# Patient Record
Sex: Female | Born: 1935 | Race: White | Hispanic: No | Marital: Married | Smoking: Never smoker
Health system: Southern US, Community
[De-identification: ages and names within clinical notes are randomized; demographics above are authoritative.]

## PROBLEM LIST (undated history)

## (undated) DIAGNOSIS — Z8489 Family history of other specified conditions: Secondary | ICD-10-CM

## (undated) DIAGNOSIS — M51369 Other intervertebral disc degeneration, lumbar region without mention of lumbar back pain or lower extremity pain: Secondary | ICD-10-CM

## (undated) DIAGNOSIS — I10 Essential (primary) hypertension: Secondary | ICD-10-CM

## (undated) DIAGNOSIS — R7303 Prediabetes: Secondary | ICD-10-CM

## (undated) DIAGNOSIS — M1990 Unspecified osteoarthritis, unspecified site: Secondary | ICD-10-CM

## (undated) HISTORY — PX: RECTOCELE REPAIR: SHX761

## (undated) HISTORY — PX: COLONOSCOPY: SHX174

## (undated) HISTORY — PX: CATARACT EXTRACTION: SUR2

## (undated) HISTORY — PX: ABDOMINAL HYSTERECTOMY: SHX81

---

## 1999-05-18 ENCOUNTER — Encounter: Payer: Self-pay | Admitting: Obstetrics and Gynecology

## 1999-05-18 ENCOUNTER — Encounter: Admission: RE | Admit: 1999-05-18 | Discharge: 1999-05-18 | Payer: Self-pay | Admitting: Obstetrics and Gynecology

## 1999-06-10 ENCOUNTER — Encounter: Admission: RE | Admit: 1999-06-10 | Discharge: 1999-06-10 | Payer: Self-pay | Admitting: Obstetrics and Gynecology

## 1999-06-10 ENCOUNTER — Encounter: Payer: Self-pay | Admitting: Obstetrics and Gynecology

## 1999-06-24 ENCOUNTER — Ambulatory Visit (HOSPITAL_COMMUNITY): Admission: RE | Admit: 1999-06-24 | Discharge: 1999-06-24 | Payer: Self-pay | Admitting: *Deleted

## 1999-06-24 ENCOUNTER — Encounter: Payer: Self-pay | Admitting: *Deleted

## 1999-12-23 ENCOUNTER — Encounter: Admission: RE | Admit: 1999-12-23 | Discharge: 1999-12-23 | Payer: Self-pay | Admitting: *Deleted

## 1999-12-23 ENCOUNTER — Encounter: Payer: Self-pay | Admitting: *Deleted

## 2000-07-05 ENCOUNTER — Encounter: Admission: RE | Admit: 2000-07-05 | Discharge: 2000-07-05 | Payer: Self-pay | Admitting: Obstetrics and Gynecology

## 2000-07-05 ENCOUNTER — Encounter: Payer: Self-pay | Admitting: Obstetrics and Gynecology

## 2001-07-10 ENCOUNTER — Encounter: Payer: Self-pay | Admitting: Family Medicine

## 2001-07-10 ENCOUNTER — Encounter: Admission: RE | Admit: 2001-07-10 | Discharge: 2001-07-10 | Payer: Self-pay | Admitting: Family Medicine

## 2002-08-29 ENCOUNTER — Encounter: Payer: Self-pay | Admitting: Obstetrics and Gynecology

## 2002-08-29 ENCOUNTER — Encounter: Admission: RE | Admit: 2002-08-29 | Discharge: 2002-08-29 | Payer: Self-pay | Admitting: Obstetrics and Gynecology

## 2002-09-19 ENCOUNTER — Encounter: Admission: RE | Admit: 2002-09-19 | Discharge: 2002-12-18 | Payer: Self-pay | Admitting: Family Medicine

## 2003-08-27 ENCOUNTER — Encounter: Admission: RE | Admit: 2003-08-27 | Discharge: 2003-08-27 | Payer: Self-pay | Admitting: Obstetrics and Gynecology

## 2003-09-02 ENCOUNTER — Encounter: Admission: RE | Admit: 2003-09-02 | Discharge: 2003-09-02 | Payer: Self-pay | Admitting: Obstetrics and Gynecology

## 2003-10-17 ENCOUNTER — Ambulatory Visit (HOSPITAL_COMMUNITY): Admission: RE | Admit: 2003-10-17 | Discharge: 2003-10-17 | Payer: Self-pay | Admitting: Gastroenterology

## 2003-12-05 ENCOUNTER — Observation Stay (HOSPITAL_COMMUNITY): Admission: RE | Admit: 2003-12-05 | Discharge: 2003-12-06 | Payer: Self-pay | Admitting: Obstetrics and Gynecology

## 2003-12-05 ENCOUNTER — Encounter (INDEPENDENT_AMBULATORY_CARE_PROVIDER_SITE_OTHER): Payer: Self-pay | Admitting: Specialist

## 2004-11-11 ENCOUNTER — Encounter: Admission: RE | Admit: 2004-11-11 | Discharge: 2004-11-11 | Payer: Self-pay | Admitting: Family Medicine

## 2005-03-15 IMAGING — CR DG CHEST 2V
2 series · 2 of 2 positions shown · non-contrast
Comparison: none

CLINICAL DATA: Rectocele.  Preop respiratory exam. 
 CHEST (TWO VIEWS) 11/28/03

[view not recorded (1 of 2)]
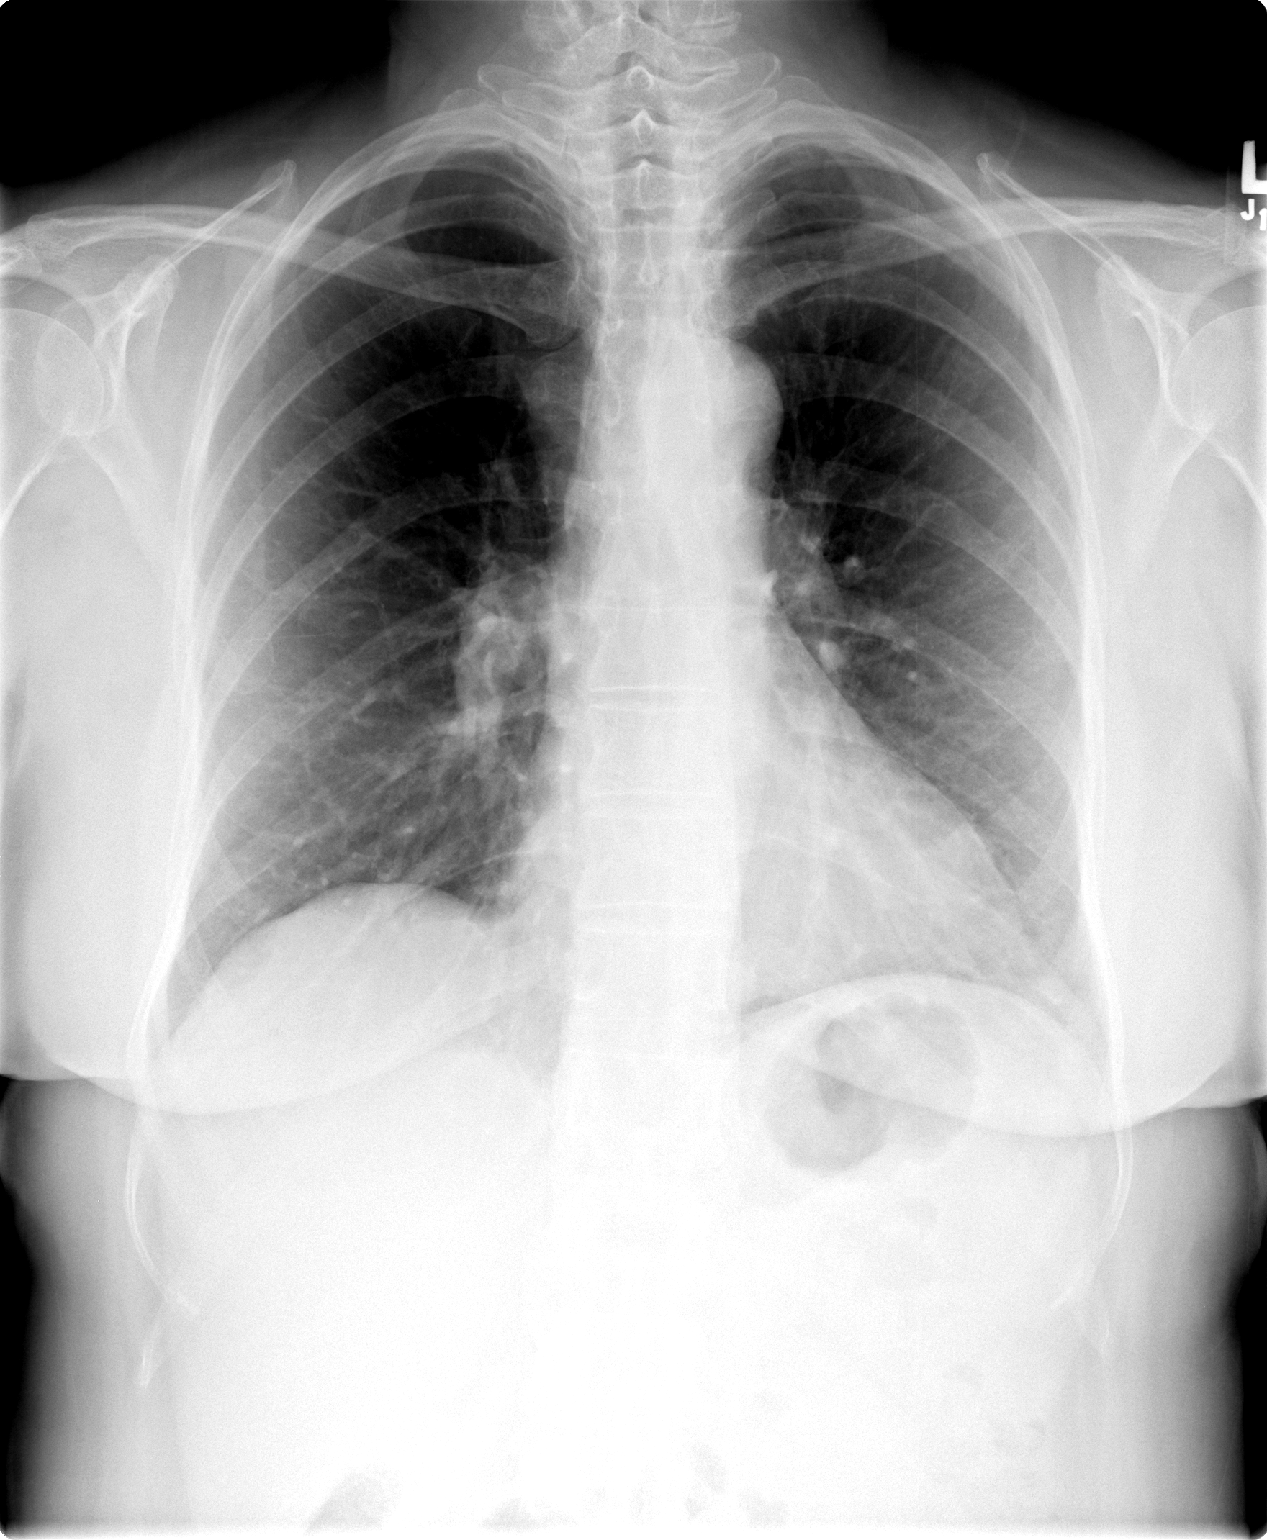

[view not recorded (2 of 2)]
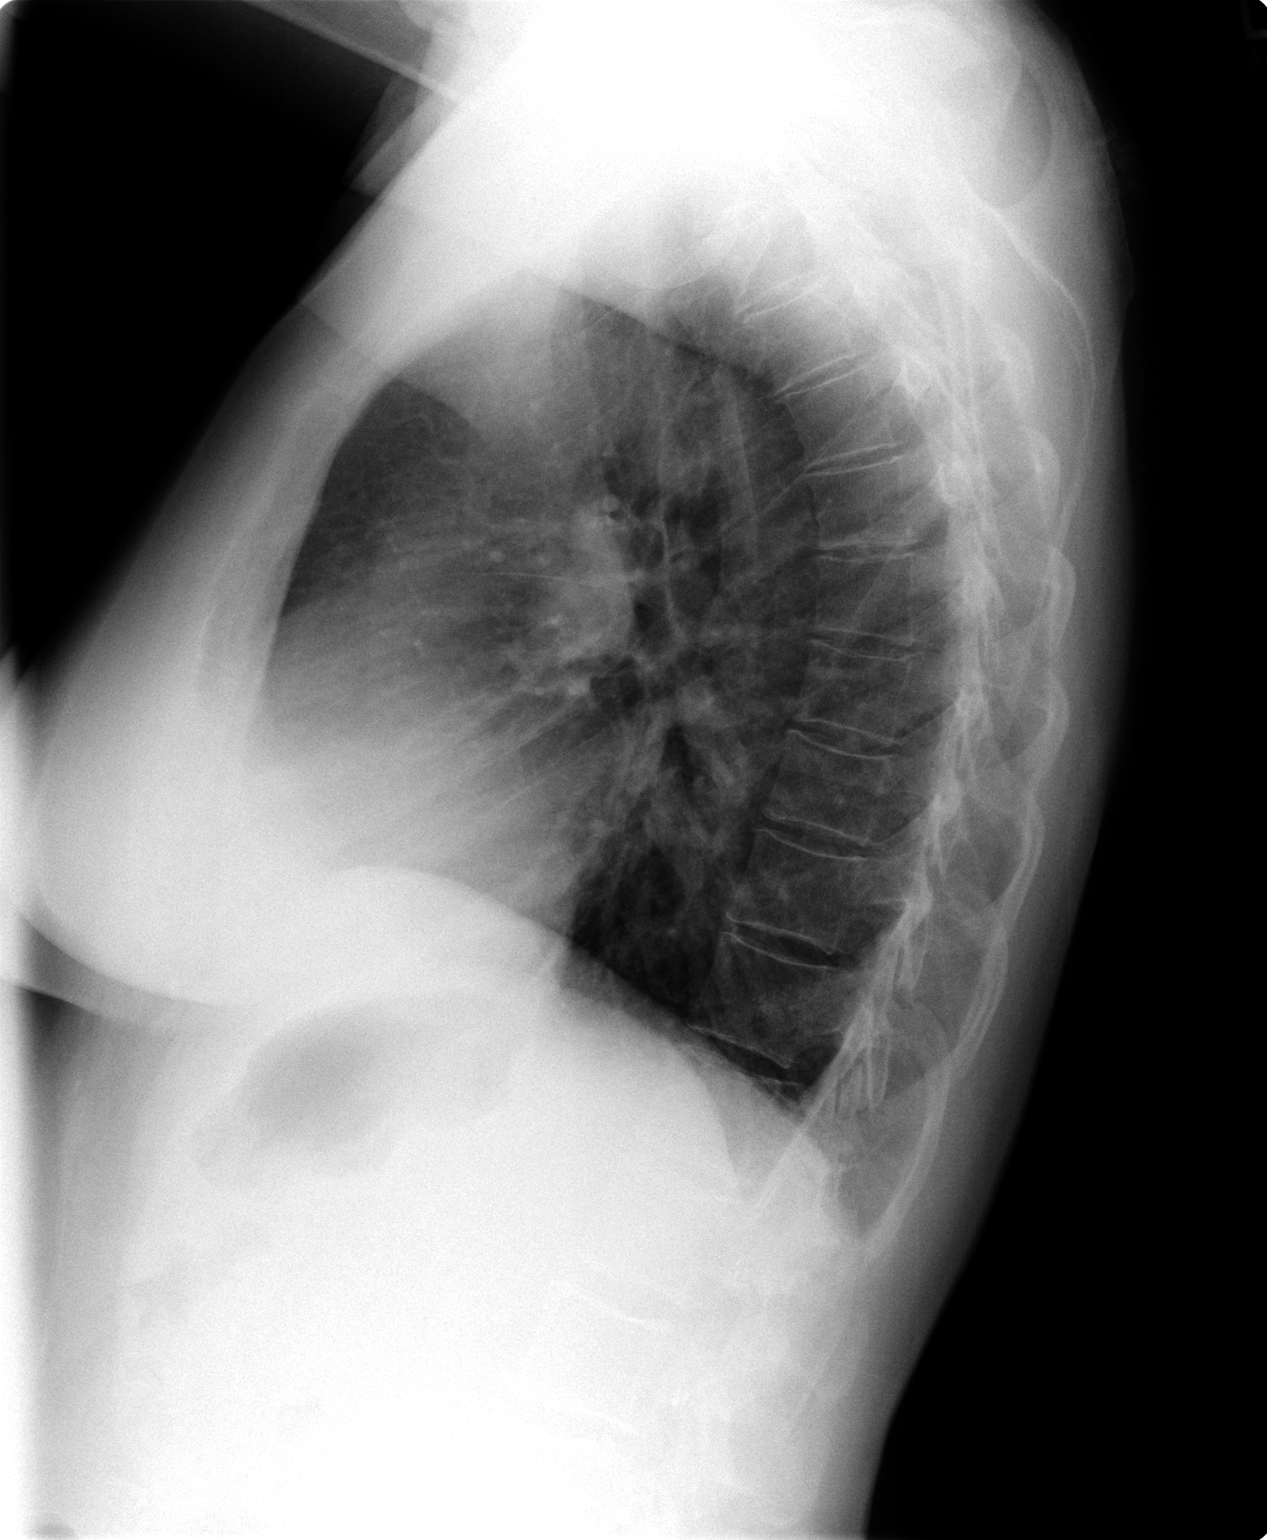

[2 of 2 positions shown; findings below may reference images not displayed]

FINDINGS: Minimal peribronchial thickening is noted and may be chronic.  A few small scattered calcified granulomas in the lungs are noted.  Reminder of lungs are clear.  The bony thorax and upper abdomen are unremarkable. 
 IMPRESSION
 No evidence of acute cardiopulmonary disease. 
 Evidence of previous granulomatous disease and mild bronchitic changes.

## 2005-09-13 ENCOUNTER — Encounter: Admission: RE | Admit: 2005-09-13 | Discharge: 2005-10-14 | Payer: Self-pay | Admitting: Family Medicine

## 2005-11-15 ENCOUNTER — Encounter: Admission: RE | Admit: 2005-11-15 | Discharge: 2005-12-09 | Payer: Self-pay | Admitting: Orthopedic Surgery

## 2006-01-05 ENCOUNTER — Encounter: Admission: RE | Admit: 2006-01-05 | Discharge: 2006-01-05 | Payer: Self-pay | Admitting: Obstetrics and Gynecology

## 2006-05-20 ENCOUNTER — Ambulatory Visit (HOSPITAL_BASED_OUTPATIENT_CLINIC_OR_DEPARTMENT_OTHER): Admission: RE | Admit: 2006-05-20 | Discharge: 2006-05-20 | Payer: Self-pay | Admitting: Orthopedic Surgery

## 2007-01-31 ENCOUNTER — Encounter: Admission: RE | Admit: 2007-01-31 | Discharge: 2007-01-31 | Payer: Self-pay | Admitting: Obstetrics and Gynecology

## 2008-03-12 ENCOUNTER — Encounter: Admission: RE | Admit: 2008-03-12 | Discharge: 2008-03-12 | Payer: Self-pay | Admitting: Obstetrics and Gynecology

## 2008-04-03 ENCOUNTER — Encounter: Admission: RE | Admit: 2008-04-03 | Discharge: 2008-04-03 | Payer: Self-pay | Admitting: Family Medicine

## 2009-04-02 ENCOUNTER — Encounter: Admission: RE | Admit: 2009-04-02 | Discharge: 2009-04-02 | Payer: Self-pay | Admitting: Obstetrics and Gynecology

## 2009-05-27 ENCOUNTER — Ambulatory Visit: Payer: Self-pay | Admitting: Internal Medicine

## 2009-06-11 LAB — CBC WITH DIFFERENTIAL/PLATELET
Eosinophils Absolute: 0.3 10*3/uL (ref 0.0–0.5)
HCT: 41.6 % (ref 34.8–46.6)
LYMPH%: 29.5 % (ref 14.0–49.7)
MONO#: 0.7 10*3/uL (ref 0.1–0.9)
NEUT#: 4.3 10*3/uL (ref 1.5–6.5)
NEUT%: 56.8 % (ref 38.4–76.8)
Platelets: 238 10*3/uL (ref 145–400)
WBC: 7.5 10*3/uL (ref 3.9–10.3)

## 2009-06-17 LAB — COMPREHENSIVE METABOLIC PANEL
CO2: 25 mEq/L (ref 19–32)
Calcium: 9.6 mg/dL (ref 8.4–10.5)
Glucose, Bld: 98 mg/dL (ref 70–99)
Sodium: 141 mEq/L (ref 135–145)
Total Bilirubin: 0.5 mg/dL (ref 0.3–1.2)
Total Protein: 6.9 g/dL (ref 6.0–8.3)

## 2009-06-17 LAB — OTHER SOLSTAS TEST

## 2009-06-17 LAB — LACTATE DEHYDROGENASE: LDH: 142 U/L (ref 94–250)

## 2010-05-22 ENCOUNTER — Encounter
Admission: RE | Admit: 2010-05-22 | Discharge: 2010-05-22 | Payer: Self-pay | Source: Home / Self Care | Attending: Obstetrics and Gynecology | Admitting: Obstetrics and Gynecology

## 2010-07-05 ENCOUNTER — Encounter: Payer: Self-pay | Admitting: Family Medicine

## 2010-07-05 ENCOUNTER — Encounter: Payer: Self-pay | Admitting: Obstetrics and Gynecology

## 2010-10-30 NOTE — H&P (Signed)
NAME:  Monica Henderson, Monica Henderson                    ACCOUNT NO.:  [id number]   MEDICAL RECORD NO.:  [id number]                   PATIENT TYPE:  AMB   LOCATION:  DFTL                                 FACILITY:  [facility]   PHYSICIAN:  Katherine Roan, M.D.               DATE OF BIRTH:  [birth year]   DATE OF ADMISSION:  DATE OF DISCHARGE:                                HISTORY & PHYSICAL   CHIEF COMPLAINT:  Pelvic bulging and perineal bulging.   HISTORY OF PRESENT ILLNESS:  Monica Henderson is a 76 year old gravida 3, para  2 female, status post abdominal hysterectomy done in 1985 by Dr. Homero Fellers  __________ for abnormal bleeding who presents for repair of a complex  posterior segment relaxation consisting of enterocele and rectocele and  probable perineoplasty. Her comorbidities include hypertension for which she  takes Norvasc, Dyazide, and potassium. She has no known drug allergies. She  has never received a blood transfusion. She had a tubal ligation in 1971.   REVIEW OF SYSTEMS:  HEENT: She wears glasses, but has noted no decrease in  visual or auditory acuity. No headache. HEART: No history of chest pain or  shortness of breath. Her hypertension is well controlled at this point. She  denies history of heart murmur or mitral valve prolapse.  LUNGS: No chronic  cough. GU: She denies stress incontinence, but notes some times she has  difficulty emptying her bladder. She has noted perineal bulging and just  some vague lower abdominal discomfort.  She has had no bowel habit change.  No melena. Recent colonoscopy was normal.  MUSCLES, BONES, AND JOINTS: No  fractures or arthritis.   SOCIAL HISTORY:  She is retired. She drinks alcohol seldom, socially seldom.   FAMILY HISTORY:  Mother died at age 72. Father died at 3 with lung cancer.  She has two brothers. One has a stent and one sister had a cerebral  aneurysm. She has a paternal aunt with colon cancer. No diabetes.   PHYSICAL EXAMINATION:   GENERAL: A well-developed, well-nourished, and alert  female who is oriented to time, place, and recent events and appears to be  her stated age.  HEENT: Eyes, ears, nose, and throat are unremarkable. Oropharynx is not  injected.  NECK: Supple. Carotid pulses are equal without bruits. Trachea is in the  midline. No adenopathy appreciated.  BREASTS: No masses or tenderness.  LUNGS: Clear to auscultation and percussion.  HEART: Normal sinus rhythm with no murmurs. No heaves, thrills, rubs, or  gallops.  ABDOMEN: Soft. Liver, spleen, and kidneys are not palpated. Bowel sounds are  normal. No bruits are heard. No tenderness.  EXTREMITIES: Good range of motion. Equal pulses and reflexes.  PELVIC EXAM: A complex posterior relaxation with a large what I would call a  cystocele or enterocele and rectocele. No pelvic masses are noted.   IMPRESSION:  Large posterior segment relaxation with enterocele and  rectocele.   PLAN:  Complex repair. I have discussed risks and benefits with the patient  including failure rates and dyspareunia, damage to bladder and bowel. She is  prepared to proceed.                                               Katherine Roan, M.D.    SDM/MEDQ  D:  12/03/2003  T:  12/03/2003  Job:  [id number]   cc:   Vikki Ports, M.D.  [address]  [place]  [place] [address]  Fax: [phone]

## 2010-10-30 NOTE — Op Note (Signed)
NAMECLORINDA, Monica Henderson          ACCOUNT NO.:  [id number]   MEDICAL RECORD NO.:  [id number]          PATIENT TYPE:  AMB   LOCATION:  DSC                          FACILITY:  [facility]   PHYSICIAN:  Cindee Salt, M.D.       DATE OF BIRTH:  14-Feb-1935   DATE OF PROCEDURE:  05/20/2006  DATE OF DISCHARGE:                               OPERATIVE REPORT   PREOPERATIVE DIAGNOSIS:  Foreign body, nail bed, right little finger.   POSTOPERATIVE DIAGNOSIS:  Foreign body, nail bed, right little finger.   OPERATION:  Removal of nail plate, foreign body, right little finger  nail bed.   SURGEON:  Merlyn Lot, M.D.   ASSISTANTCarolyne Fiscal, R.N.   ANESTHESIA:  Metacarpal block.   HISTORY:  The patient is a 76 year old female who suffered an injury to  her right little finger, suffering a foreign body under the nail plate  in the nail bed.  She is desirous of removal.  Attempted removal was  unsuccessful.  She is brought to the minor room where a metacarpal block  was given with 1% Xylocaine without epinephrine, prepped using DuraPrep,  supine position, left arm free.  A Penrose drain was used for tourniquet  control at the base of the finger after a metacarpal block was given  with 1% Xylocaine without epinephrine; 4 mL was used.  Adequate  anesthesia was afforded.  The nail plate was removed.  The foreign body  was jammed into a portion of the nail plate, and this came off with the  nail plate.  A groove was present in the nail fold but no laceration.  The area was irrigated.  A nonadherent gauze was placed under the  proximal nail fold.  A sterile compressive dressing applied.  The  patient tolerated the procedure well.  She is discharged home to return  to the [facility] in 1 week on Darvocet-N 100.           ______________________________  Cindee Salt, M.D.     GK/MEDQ  D:  05/20/2006  T:  05/20/2006  Job:  [id number]

## 2010-10-30 NOTE — Op Note (Signed)
NAME:  Monica Henderson, Monica Henderson                    ACCOUNT NO.:  [id number]   MEDICAL RECORD NO.:  [id number]                   PATIENT TYPE:  AMB   LOCATION:  DFTL                                 FACILITY:  [facility]   PHYSICIAN:  Katherine Roan, M.D.               DATE OF BIRTH:  February 28, 1935   DATE OF PROCEDURE:  12/05/2003  DATE OF DISCHARGE:                                 OPERATIVE REPORT   PREOPERATIVE DIAGNOSES:  Pelvic relaxation.   POSTOPERATIVE DIAGNOSES:  Cystocele, rectocele, enterocele and perineal  detachment.   OPERATION PERFORMED:  Pelvic exam under anesthesia, anterior repair, repair  of enterocele, posterior repair and perineoplasty.   DESCRIPTION OF PROCEDURE:  The patient was placed in lithotomy position,  examined and found to have prolapse of the apex of the vagina as well as the  large enterocele.  The patient was then prepped for surgery, Foley catheter  was inserted, apex of the vagina was grasped and the enterocele sac was  dissected and entered, the peritoneal cavity entered. There were no  adhesions to the dome of the uterus.  I was able to feel the ureteral pop-up  anteriorly on both sides. The cystocele was then dissected carefully up to  the urethra and the pubocervical vaginal fascia and pelvic diaphragm was  dissected laterally.  The posterior segment tissue was noted just under the  enterocele sac and that was identified. The posterior peritoneum including  the very inferior portion of the uterosacral ligaments were then sutured  with 3-0 Ethibond and plicated in the midline.  The pubocervical vagina  fascia was then plicated with interrupted sutures of 3-0 Vicryl and 3-0 PDS.  I then attached the posterior segment of tissue to the anterior tissue and  then continued to plicate the fascia in the midline with interrupted sutures  of 3-0 Vicryl.  A wedge of the vagina was then removed and then the vagina  anteriorly was closed with interrupted sutures of 3-0  Vicryl.  Posteriorly  we went down below and dissected the perineal body, took a wedge of the  perineum out and dissected the vagina laterally to plicate the prerectal  fascia in the midline. I then inserted a pack in the vagina on 1/2 inch  iodoform.  The perineoplasty was accomplished by closing the bulbocavernosus  muscle and superficial transverse perinei muscle with interrupted sutures of  3-0 Vicryl. The skin was closed with subcuticular 3-0 Vicryl and the vaginal  mucosa was closed posteriorly with a locking suture of 2-0 chromic.  The  pack was inserted, Foley catheter was draining clear urine. [name]  tolerated the procedure well and was sent to the recovery room in good  condition.  Katherine Roan, M.D.    SDM/MEDQ  D:  12/05/2003  T:  12/05/2003  Job:  [id number]   cc:   Vikki Ports, M.D.  [address]  [place]  [place] [address]  Fax: [phone]

## 2010-10-30 NOTE — Discharge Summary (Signed)
NAME:  Monica Henderson, Monica Henderson                    ACCOUNT NO.:  [id number]   MEDICAL RECORD NO.:  [id number]                   PATIENT TYPE:  OBV   LOCATION:  0457                                 FACILITY:  [facility]   PHYSICIAN:  Katherine Roan, M.D.               DATE OF BIRTH:  June 15, 1934   DATE OF ADMISSION:  12/05/2003  DATE OF DISCHARGE:  12/06/2003                                 DISCHARGE SUMMARY   ADMISSION DIAGNOSIS:  Pelvic relaxation.   DISCHARGE DIAGNOSIS:  Pelvic relaxation with repair of level 1, 2, and 3  support.   HISTORY:  [name] is a 76 year old female, post abdominal  hysterectomy in 1985, who presents for repair of what initially was thought  to be a large enterocele and rectocele.  She complained of perineal bulging  and pelvic discomfort.  She was a healthy lady for her age.  On no  particular medication.   LABORATORY DATA:  Hemoglobin on admission was 13.  On December 06, 2003, it was  10.8.  On December 06, 2003, her potassium was 3.   Echocardiogram was normal.  Chest x-ray was normal.   HOSPITAL COURSE:  This patient was admitted to the hospital for the above  proposed surgical procedure.  In the operating room, complete relaxation was  noted of the anterior and posterior segments with repair of a cystocele,  rectocele, enterocele, and perineoplasty or level 1, 2, and 3 support.  Pack  was removed on December 04, 2003.  Because subcomplex repair was performed, we  elected to leave the Foley in.  She is to be covered with Cipro, and in a  couple of days we will remove the Foley to check post-void residual.  Her  postoperative course was smooth.  On discharge, she was sent home with  Phenergan, Percocet, and Cipro for pain.  She will return to the office in  approximately four days.   CONDITION ON DISCHARGE:  Improved.                                               Katherine Roan, M.D.    SDM/MEDQ  D:  12/24/2003  T:  12/24/2003  Job:  [id number]

## 2010-10-30 NOTE — Op Note (Signed)
NAME:  Monica Henderson, Monica Henderson                    ACCOUNT NO.:  [id number]   MEDICAL RECORD NO.:  [id number]                   PATIENT TYPE:  AMB   LOCATION:  ENDO                                 FACILITY:  [facility]   PHYSICIAN:  Graylin Shiver, M.D.                DATE OF BIRTH:  05-19-1935   DATE OF PROCEDURE:  10/17/2003  DATE OF DISCHARGE:                                 OPERATIVE REPORT   PROCEDURE:  Colonoscopy.   INDICATIONS FOR PROCEDURE:  Screening.   Informed consent was obtained after explanation of the risks of bleeding,  infection and perforation.   PREMEDICATION:  Fentanyl 60 mcg IV, Versed 6 mg IV.   DESCRIPTION OF PROCEDURE:  With the patient in the left lateral decubitus  position a rectal exam was performed.  No masses were felt.  The Olympus  colonoscope was inserted into the rectum and advanced around the colon to  the cecum.  The cecal landmarks were identified.  The cecum and ascending  colon were normal, the transverse colon normal.  The descending colon,  sigmoid and rectum was normal.  The scope was retroflexed in the rectum.  It  looked normal.  The scope was brought out.  She tolerated the procedure well  without complications.   IMPRESSION:  Normal colonoscopy to the cecum.   I would recommend a followup screening colonoscopy again in 10 years.                                               Graylin Shiver, M.D.    [name]  D:  10/17/2003  T:  10/17/2003  Job:  [id number]   cc:   McFell, Dr.

## 2011-05-28 ENCOUNTER — Other Ambulatory Visit: Payer: Self-pay | Admitting: Obstetrics and Gynecology

## 2011-05-28 DIAGNOSIS — Z1231 Encounter for screening mammogram for malignant neoplasm of breast: Secondary | ICD-10-CM

## 2011-06-22 ENCOUNTER — Ambulatory Visit
Admission: RE | Admit: 2011-06-22 | Discharge: 2011-06-22 | Disposition: A | Discharge status: Home / Self Care | Payer: Medicare Other | Source: Ambulatory Visit | Attending: Obstetrics and Gynecology | Admitting: Obstetrics and Gynecology

## 2011-06-22 DIAGNOSIS — Z1231 Encounter for screening mammogram for malignant neoplasm of breast: Secondary | ICD-10-CM

## 2012-09-18 ENCOUNTER — Other Ambulatory Visit: Payer: Self-pay | Admitting: Orthopedic Surgery

## 2012-09-18 DIAGNOSIS — M899 Disorder of bone, unspecified: Secondary | ICD-10-CM

## 2012-09-20 ENCOUNTER — Other Ambulatory Visit: Payer: Self-pay

## 2012-09-20 DIAGNOSIS — Z1231 Encounter for screening mammogram for malignant neoplasm of breast: Secondary | ICD-10-CM

## 2012-09-27 ENCOUNTER — Other Ambulatory Visit: Payer: Medicare Other

## 2012-09-27 ENCOUNTER — Ambulatory Visit
Admission: RE | Admit: 2012-09-27 | Discharge: 2012-09-27 | Disposition: A | Discharge status: Home / Self Care | Payer: Medicare Other | Source: Ambulatory Visit | Attending: Orthopedic Surgery | Admitting: Orthopedic Surgery

## 2012-09-27 ENCOUNTER — Ambulatory Visit
Admission: RE | Admit: 2012-09-27 | Discharge: 2012-09-27 | Disposition: A | Discharge status: Home / Self Care | Payer: Medicare Other | Source: Ambulatory Visit

## 2012-09-27 DIAGNOSIS — Z1231 Encounter for screening mammogram for malignant neoplasm of breast: Secondary | ICD-10-CM

## 2012-09-27 DIAGNOSIS — M949 Disorder of cartilage, unspecified: Secondary | ICD-10-CM

## 2012-09-27 DIAGNOSIS — M899 Disorder of bone, unspecified: Secondary | ICD-10-CM

## 2012-09-28 ENCOUNTER — Ambulatory Visit: Payer: Medicare Other

## 2014-02-01 ENCOUNTER — Other Ambulatory Visit: Payer: Self-pay

## 2014-02-01 DIAGNOSIS — Z1231 Encounter for screening mammogram for malignant neoplasm of breast: Secondary | ICD-10-CM

## 2014-02-14 ENCOUNTER — Ambulatory Visit
Admission: RE | Admit: 2014-02-14 | Discharge: 2014-02-14 | Disposition: A | Discharge status: Home / Self Care | Payer: Medicare HMO | Source: Ambulatory Visit

## 2014-02-14 ENCOUNTER — Encounter (INDEPENDENT_AMBULATORY_CARE_PROVIDER_SITE_OTHER): Payer: Self-pay

## 2014-02-14 DIAGNOSIS — Z1231 Encounter for screening mammogram for malignant neoplasm of breast: Secondary | ICD-10-CM

## 2015-03-28 ENCOUNTER — Other Ambulatory Visit: Payer: Self-pay

## 2015-03-28 DIAGNOSIS — Z1231 Encounter for screening mammogram for malignant neoplasm of breast: Secondary | ICD-10-CM

## 2015-04-22 ENCOUNTER — Ambulatory Visit
Admission: RE | Admit: 2015-04-22 | Discharge: 2015-04-22 | Disposition: A | Discharge status: Home / Self Care | Payer: Medicare HMO | Source: Ambulatory Visit

## 2015-04-22 DIAGNOSIS — Z1231 Encounter for screening mammogram for malignant neoplasm of breast: Secondary | ICD-10-CM

## 2015-07-04 DIAGNOSIS — I10 Essential (primary) hypertension: Secondary | ICD-10-CM | POA: Diagnosis not present

## 2015-07-04 DIAGNOSIS — E7800 Pure hypercholesterolemia, unspecified: Secondary | ICD-10-CM | POA: Diagnosis not present

## 2015-07-14 DIAGNOSIS — H43813 Vitreous degeneration, bilateral: Secondary | ICD-10-CM | POA: Diagnosis not present

## 2015-07-14 DIAGNOSIS — D3132 Benign neoplasm of left choroid: Secondary | ICD-10-CM | POA: Diagnosis not present

## 2015-07-22 DIAGNOSIS — R69 Illness, unspecified: Secondary | ICD-10-CM | POA: Diagnosis not present

## 2015-07-28 DIAGNOSIS — L820 Inflamed seborrheic keratosis: Secondary | ICD-10-CM | POA: Diagnosis not present

## 2015-07-28 DIAGNOSIS — L821 Other seborrheic keratosis: Secondary | ICD-10-CM | POA: Diagnosis not present

## 2015-07-28 DIAGNOSIS — L853 Xerosis cutis: Secondary | ICD-10-CM | POA: Diagnosis not present

## 2015-11-03 DIAGNOSIS — Z0000 Encounter for general adult medical examination without abnormal findings: Secondary | ICD-10-CM | POA: Diagnosis not present

## 2015-11-03 DIAGNOSIS — I10 Essential (primary) hypertension: Secondary | ICD-10-CM | POA: Diagnosis not present

## 2015-11-03 DIAGNOSIS — E7800 Pure hypercholesterolemia, unspecified: Secondary | ICD-10-CM | POA: Diagnosis not present

## 2015-12-31 DIAGNOSIS — E7800 Pure hypercholesterolemia, unspecified: Secondary | ICD-10-CM | POA: Diagnosis not present

## 2015-12-31 DIAGNOSIS — M8588 Other specified disorders of bone density and structure, other site: Secondary | ICD-10-CM | POA: Diagnosis not present

## 2015-12-31 DIAGNOSIS — I10 Essential (primary) hypertension: Secondary | ICD-10-CM | POA: Diagnosis not present

## 2015-12-31 DIAGNOSIS — Z0000 Encounter for general adult medical examination without abnormal findings: Secondary | ICD-10-CM | POA: Diagnosis not present

## 2015-12-31 DIAGNOSIS — Z23 Encounter for immunization: Secondary | ICD-10-CM | POA: Diagnosis not present

## 2015-12-31 DIAGNOSIS — Z1389 Encounter for screening for other disorder: Secondary | ICD-10-CM | POA: Diagnosis not present

## 2016-01-01 ENCOUNTER — Other Ambulatory Visit: Payer: Self-pay | Admitting: Family Medicine

## 2016-01-01 DIAGNOSIS — M8588 Other specified disorders of bone density and structure, other site: Secondary | ICD-10-CM

## 2016-01-06 ENCOUNTER — Other Ambulatory Visit: Payer: Self-pay | Admitting: Family Medicine

## 2016-01-06 DIAGNOSIS — E2839 Other primary ovarian failure: Secondary | ICD-10-CM

## 2016-02-04 ENCOUNTER — Ambulatory Visit
Admission: RE | Admit: 2016-02-04 | Discharge: 2016-02-04 | Disposition: A | Discharge status: Home / Self Care | Payer: Medicare HMO | Source: Ambulatory Visit | Attending: Family Medicine | Admitting: Family Medicine

## 2016-02-04 DIAGNOSIS — E2839 Other primary ovarian failure: Secondary | ICD-10-CM

## 2016-02-04 DIAGNOSIS — Z780 Asymptomatic menopausal state: Secondary | ICD-10-CM | POA: Diagnosis not present

## 2016-02-04 DIAGNOSIS — Z13820 Encounter for screening for osteoporosis: Secondary | ICD-10-CM | POA: Diagnosis not present

## 2016-03-11 DIAGNOSIS — R69 Illness, unspecified: Secondary | ICD-10-CM | POA: Diagnosis not present

## 2016-03-19 DIAGNOSIS — R109 Unspecified abdominal pain: Secondary | ICD-10-CM | POA: Diagnosis not present

## 2016-04-22 ENCOUNTER — Other Ambulatory Visit: Payer: Self-pay | Admitting: Family Medicine

## 2016-04-22 DIAGNOSIS — Z1231 Encounter for screening mammogram for malignant neoplasm of breast: Secondary | ICD-10-CM

## 2016-05-17 ENCOUNTER — Ambulatory Visit
Admission: RE | Admit: 2016-05-17 | Discharge: 2016-05-17 | Disposition: A | Discharge status: Home / Self Care | Payer: Medicare HMO | Source: Ambulatory Visit | Attending: Family Medicine | Admitting: Family Medicine

## 2016-05-17 DIAGNOSIS — Z1231 Encounter for screening mammogram for malignant neoplasm of breast: Secondary | ICD-10-CM

## 2017-07-14 ENCOUNTER — Other Ambulatory Visit: Payer: Self-pay | Admitting: Family Medicine

## 2017-07-14 DIAGNOSIS — Z1231 Encounter for screening mammogram for malignant neoplasm of breast: Secondary | ICD-10-CM

## 2017-08-04 ENCOUNTER — Ambulatory Visit: Payer: Medicare HMO

## 2017-09-01 ENCOUNTER — Other Ambulatory Visit: Payer: Self-pay | Admitting: Family Medicine

## 2017-09-01 ENCOUNTER — Ambulatory Visit
Admission: RE | Admit: 2017-09-01 | Discharge: 2017-09-01 | Disposition: A | Discharge status: Home / Self Care | Payer: Medicare HMO | Source: Ambulatory Visit | Attending: Family Medicine | Admitting: Family Medicine

## 2017-09-01 DIAGNOSIS — Z1231 Encounter for screening mammogram for malignant neoplasm of breast: Secondary | ICD-10-CM

## 2019-05-01 ENCOUNTER — Other Ambulatory Visit: Payer: Self-pay | Admitting: Family Medicine

## 2019-05-01 DIAGNOSIS — Z1231 Encounter for screening mammogram for malignant neoplasm of breast: Secondary | ICD-10-CM

## 2019-05-09 ENCOUNTER — Other Ambulatory Visit: Payer: Self-pay

## 2019-05-09 ENCOUNTER — Ambulatory Visit
Admission: RE | Admit: 2019-05-09 | Discharge: 2019-05-09 | Disposition: A | Discharge status: Home / Self Care | Payer: Medicare HMO | Source: Ambulatory Visit | Attending: Family Medicine | Admitting: Family Medicine

## 2019-05-09 DIAGNOSIS — Z1231 Encounter for screening mammogram for malignant neoplasm of breast: Secondary | ICD-10-CM

## 2020-01-14 ENCOUNTER — Other Ambulatory Visit: Payer: Self-pay | Admitting: Family Medicine

## 2020-01-14 DIAGNOSIS — E2839 Other primary ovarian failure: Secondary | ICD-10-CM

## 2020-01-22 ENCOUNTER — Other Ambulatory Visit: Payer: Self-pay | Admitting: Family Medicine

## 2020-01-22 DIAGNOSIS — Z1231 Encounter for screening mammogram for malignant neoplasm of breast: Secondary | ICD-10-CM

## 2020-05-12 ENCOUNTER — Other Ambulatory Visit: Payer: Self-pay

## 2020-05-12 ENCOUNTER — Ambulatory Visit (INDEPENDENT_AMBULATORY_CARE_PROVIDER_SITE_OTHER): Payer: Medicare HMO | Admitting: Ophthalmology

## 2020-05-12 ENCOUNTER — Encounter (INDEPENDENT_AMBULATORY_CARE_PROVIDER_SITE_OTHER): Payer: Self-pay | Admitting: Ophthalmology

## 2020-05-12 DIAGNOSIS — H43813 Vitreous degeneration, bilateral: Secondary | ICD-10-CM | POA: Diagnosis not present

## 2020-05-12 DIAGNOSIS — D3132 Benign neoplasm of left choroid: Secondary | ICD-10-CM | POA: Insufficient documentation

## 2020-05-12 NOTE — Assessment & Plan Note (Signed)

## 2020-05-12 NOTE — Progress Notes (Signed)
05/12/2020     CHIEF COMPLAINT Patient presents for Retina Follow Up   HISTORY OF PRESENT ILLNESS: Monica Henderson is a 85 y.o. female who presents to the clinic today for:   HPI    Retina Follow Up    Patient presents with  PVD.  In both eyes.  This started 1 year ago.  Severity is mild.  Duration of 1 year.  Since onset it is stable.          Comments    1 YR F/U OU   Pt reports stable vision, no new f/f, no pain or pressure. Pt using Preservision per [place].        Last edited by Nichola Sizer D on 05/12/2020 10:06 AM. (History)      Referring physician: Leighton Ruff, MD [place],  [place] [address]  HISTORICAL INFORMATION:   Selected notes from the MEDICAL RECORD NUMBER       CURRENT MEDICATIONS: No current outpatient medications on file. (Ophthalmic Drugs)   No current facility-administered medications for this visit. (Ophthalmic Drugs)   No current outpatient medications on file. (Other)   No current facility-administered medications for this visit. (Other)      REVIEW OF SYSTEMS:    ALLERGIES Not on File  PAST MEDICAL HISTORY History reviewed. No pertinent past medical history. History reviewed. No pertinent surgical history.  FAMILY HISTORY History reviewed. No pertinent family history.  SOCIAL HISTORY Social History   Tobacco Use   Smoking status: Never Smoker   Smokeless tobacco: Never Used  Substance Use Topics   Alcohol use: Not on file   Drug use: Not on file         OPHTHALMIC EXAM:  Base Eye Exam    Visual Acuity (ETDRS)      Right Left   Dist [place] 20/40 +1 20/25 -1   Dist ph [place] 20/25        Tonometry (Tonopen, 10:13 AM)      Right Left   Pressure 14 16       Pupils      Pupils Dark Light Shape React APD   Right PERRL 4 4 Round Brisk None   Left PERRL 4 4 Round Brisk None       Visual Fields (Counting fingers)      Left Right    Full Full       Extraocular Movement       Right Left    Full Full       Neuro/Psych    Oriented x3: Yes       Dilation    Both eyes: 1.0% Mydriacyl, 2.5% Phenylephrine @ 10:13 AM        Slit Lamp and Fundus Exam    External Exam      Right Left   External Normal Normal       Slit Lamp Exam      Right Left   Lids/Lashes Normal Normal   Conjunctiva/Sclera White and quiet White and quiet   Cornea Clear Clear   Anterior Chamber Deep and quiet Deep and quiet   Iris Round and reactive Round and reactive   Lens Centered posterior chamber intraocular lens Centered posterior chamber intraocular lens   Anterior Vitreous Normal Normal       Fundus Exam      Right Left   Posterior Vitreous Posterior vitreous detachment Posterior vitreous detachment   Disc Normal Normal   C/D Ratio 0.2 0.2  Macula Normal Normal   Vessels Normal Normal   Periphery Normal Choroidal nevus in posterior segment, along inferotemporal arcade, 2 disc diameters horizontal and 2.5 disc diameters vertical, no atrophy, no lipofuscin, no subretinal fluid, no appreciable thickness          IMAGING AND PROCEDURES  Imaging and Procedures for 05/12/20  Color Fundus Photography Optos - OU - Both Eyes       Right Eye Progression has been stable. Disc findings include normal observations. Macula : normal observations. Vessels : normal observations. Periphery : normal observations.   Left Eye Progression has been stable. Disc findings include normal observations. Macula : normal observations. Vessels : normal observations.   Notes Choroidal nevus, regular pigmented, no vascularity, no subretinal fluid, no atrophy, no appreciable change in size over time                ASSESSMENT/PLAN:  Posterior vitreous detachment of both eyes   The nature of posterior vitreous detachment was discussed with the patient as well as its physiology, its age prevalence, and its possible implication regarding retinal breaks and detachment.  An informational  brochure was given to the patient.  All the patient's questions were answered.  The patient was asked to return if new or different flashes or floaters develops.   Patient was instructed to contact office immediately if any changes were noticed. I explained to the patient that vitreous inside the eye is similar to jello inside a bowl. As the jello melts it can start to pull away from the bowl, similarly the vitreous throughout our lives can begin to pull away from the retina. That process is called a posterior vitreous detachment. In some cases, the vitreous can tug hard enough on the retina to form a retinal tear. I discussed with the patient the signs and symptoms of a retinal detachment.  Do not rub the eye.  Choroidal nevus, left The nature of choroidal nevus was discussed with the patient and an informational form was offered.  Photo documentation was discussed with the patient.  Periodic follow-up may be needed for a lifetime. The patient's questions were answered. At minimum, annual exams will be needed if no signs of early growth.      ICD-10-CM   1. Choroidal nevus, left  D31.32 Color Fundus Photography Optos - OU - Both Eyes  2. Posterior vitreous detachment of both eyes  H43.813     1.  No interval change in size, or appearance of choroidal nevus, no high risk features will observe  2.  3.  Ophthalmic Meds Ordered this visit:  No orders of the defined types were placed in this encounter.      Return in about 1 year (around 05/12/2021) for COLOR FP, DILATE OU, B-Scan U/S, OS.  There are no Patient Instructions on file for this visit.   Explained the diagnoses, plan, and follow up with the patient and they expressed understanding.  Patient expressed understanding of the importance of proper follow up care.   Clent Demark Shahrzad Koble M.D. Diseases & Surgery of the Retina and Vitreous Retina & Diabetic [place] 05/12/20     Abbreviations: M myopia (nearsighted); A astigmatism;  H hyperopia (farsighted); P presbyopia; Mrx spectacle prescription;  CTL contact lenses; OD right eye; OS left eye; OU both eyes  XT exotropia; ET esotropia; PEK punctate epithelial keratitis; PEE punctate epithelial erosions; DES dry eye syndrome; MGD meibomian gland dysfunction; ATs artificial tears; PFAT's preservative free artificial tears; [place]arton nuclear sclerotic  cataract; PSC posterior subcapsular cataract; ERM epi-retinal membrane; PVD posterior vitreous detachment; RD retinal detachment; DM diabetes mellitus; DR diabetic retinopathy; NPDR non-proliferative diabetic retinopathy; PDR proliferative diabetic retinopathy; CSME clinically significant macular edema; DME diabetic macular edema; dbh dot blot hemorrhages; CWS cotton wool spot; POAG primary open angle glaucoma; C/D cup-to-disc ratio; HVF humphrey visual field; GVF goldmann visual field; OCT optical coherence tomography; IOP intraocular pressure; BRVO Branch retinal vein occlusion; CRVO central retinal vein occlusion; CRAO central retinal artery occlusion; BRAO branch retinal artery occlusion; RT retinal tear; SB scleral buckle; PPV pars plana vitrectomy; VH Vitreous hemorrhage; PRP panretinal laser photocoagulation; IVK intravitreal kenalog; VMT vitreomacular traction; MH Macular hole;  NVD neovascularization of the disc; NVE neovascularization elsewhere; AREDS age related eye disease study; ARMD age related macular degeneration; POAG primary open angle glaucoma; EBMD epithelial/anterior basement membrane dystrophy; ACIOL anterior chamber intraocular lens; IOL intraocular lens; PCIOL posterior chamber intraocular lens; Phaco/IOL phacoemulsification with intraocular lens placement; Ferguson photorefractive keratectomy; LASIK laser assisted in situ keratomileusis; HTN hypertension; DM diabetes mellitus; COPD chronic obstructive pulmonary disease

## 2020-05-12 NOTE — Assessment & Plan Note (Signed)
The nature of choroidal nevus was discussed with the patient and an informational form was offered.  Photo documentation was discussed with the patient.  Periodic follow-up may be needed for a lifetime. The patient's questions were answered. At minimum, annual exams will be needed if no signs of early growth. 

## 2020-05-14 ENCOUNTER — Other Ambulatory Visit: Payer: Medicare HMO

## 2020-05-14 ENCOUNTER — Ambulatory Visit: Payer: Medicare HMO

## 2020-06-23 DIAGNOSIS — E7800 Pure hypercholesterolemia, unspecified: Secondary | ICD-10-CM | POA: Diagnosis not present

## 2020-06-23 DIAGNOSIS — M1990 Unspecified osteoarthritis, unspecified site: Secondary | ICD-10-CM | POA: Diagnosis not present

## 2020-06-23 DIAGNOSIS — I10 Essential (primary) hypertension: Secondary | ICD-10-CM | POA: Diagnosis not present

## 2020-07-16 DIAGNOSIS — Z6827 Body mass index (BMI) 27.0-27.9, adult: Secondary | ICD-10-CM | POA: Diagnosis not present

## 2020-07-16 DIAGNOSIS — R7303 Prediabetes: Secondary | ICD-10-CM | POA: Diagnosis not present

## 2020-07-16 DIAGNOSIS — E7800 Pure hypercholesterolemia, unspecified: Secondary | ICD-10-CM | POA: Diagnosis not present

## 2020-07-16 DIAGNOSIS — I10 Essential (primary) hypertension: Secondary | ICD-10-CM | POA: Diagnosis not present

## 2020-07-16 DIAGNOSIS — E663 Overweight: Secondary | ICD-10-CM | POA: Diagnosis not present

## 2020-08-15 ENCOUNTER — Ambulatory Visit: Payer: Medicare HMO

## 2020-08-15 ENCOUNTER — Other Ambulatory Visit: Payer: Medicare HMO

## 2020-09-30 DIAGNOSIS — H43813 Vitreous degeneration, bilateral: Secondary | ICD-10-CM | POA: Diagnosis not present

## 2020-09-30 DIAGNOSIS — H04123 Dry eye syndrome of bilateral lacrimal glands: Secondary | ICD-10-CM | POA: Diagnosis not present

## 2020-09-30 DIAGNOSIS — D3132 Benign neoplasm of left choroid: Secondary | ICD-10-CM | POA: Diagnosis not present

## 2020-09-30 DIAGNOSIS — Z961 Presence of intraocular lens: Secondary | ICD-10-CM | POA: Diagnosis not present

## 2020-09-30 DIAGNOSIS — H353131 Nonexudative age-related macular degeneration, bilateral, early dry stage: Secondary | ICD-10-CM | POA: Diagnosis not present

## 2020-09-30 DIAGNOSIS — H10413 Chronic giant papillary conjunctivitis, bilateral: Secondary | ICD-10-CM | POA: Diagnosis not present

## 2020-10-14 DIAGNOSIS — E7800 Pure hypercholesterolemia, unspecified: Secondary | ICD-10-CM | POA: Diagnosis not present

## 2020-10-20 DIAGNOSIS — I10 Essential (primary) hypertension: Secondary | ICD-10-CM | POA: Diagnosis not present

## 2020-10-20 DIAGNOSIS — E7800 Pure hypercholesterolemia, unspecified: Secondary | ICD-10-CM | POA: Diagnosis not present

## 2020-10-20 DIAGNOSIS — M1990 Unspecified osteoarthritis, unspecified site: Secondary | ICD-10-CM | POA: Diagnosis not present

## 2021-01-01 DIAGNOSIS — M8580 Other specified disorders of bone density and structure, unspecified site: Secondary | ICD-10-CM | POA: Diagnosis not present

## 2021-01-01 DIAGNOSIS — I10 Essential (primary) hypertension: Secondary | ICD-10-CM | POA: Diagnosis not present

## 2021-01-01 DIAGNOSIS — E7800 Pure hypercholesterolemia, unspecified: Secondary | ICD-10-CM | POA: Diagnosis not present

## 2021-01-01 DIAGNOSIS — M1990 Unspecified osteoarthritis, unspecified site: Secondary | ICD-10-CM | POA: Diagnosis not present

## 2021-01-05 ENCOUNTER — Other Ambulatory Visit: Payer: Self-pay

## 2021-01-05 ENCOUNTER — Ambulatory Visit
Admission: RE | Admit: 2021-01-05 | Discharge: 2021-01-05 | Disposition: A | Discharge status: Home / Self Care | Payer: Medicare HMO | Source: Ambulatory Visit | Attending: Family Medicine | Admitting: Family Medicine

## 2021-01-05 DIAGNOSIS — Z1231 Encounter for screening mammogram for malignant neoplasm of breast: Secondary | ICD-10-CM

## 2021-01-05 DIAGNOSIS — E2839 Other primary ovarian failure: Secondary | ICD-10-CM

## 2021-01-05 DIAGNOSIS — Z780 Asymptomatic menopausal state: Secondary | ICD-10-CM | POA: Diagnosis not present

## 2021-01-05 DIAGNOSIS — M85832 Other specified disorders of bone density and structure, left forearm: Secondary | ICD-10-CM | POA: Diagnosis not present

## 2021-02-02 DIAGNOSIS — E663 Overweight: Secondary | ICD-10-CM | POA: Diagnosis not present

## 2021-02-02 DIAGNOSIS — E7800 Pure hypercholesterolemia, unspecified: Secondary | ICD-10-CM | POA: Diagnosis not present

## 2021-02-02 DIAGNOSIS — M8580 Other specified disorders of bone density and structure, unspecified site: Secondary | ICD-10-CM | POA: Diagnosis not present

## 2021-02-02 DIAGNOSIS — R7303 Prediabetes: Secondary | ICD-10-CM | POA: Diagnosis not present

## 2021-02-02 DIAGNOSIS — I10 Essential (primary) hypertension: Secondary | ICD-10-CM | POA: Diagnosis not present

## 2021-02-06 DIAGNOSIS — Z0000 Encounter for general adult medical examination without abnormal findings: Secondary | ICD-10-CM | POA: Diagnosis not present

## 2021-02-06 DIAGNOSIS — Z1389 Encounter for screening for other disorder: Secondary | ICD-10-CM | POA: Diagnosis not present

## 2021-03-03 DIAGNOSIS — Z7189 Other specified counseling: Secondary | ICD-10-CM | POA: Diagnosis not present

## 2021-03-03 DIAGNOSIS — M8580 Other specified disorders of bone density and structure, unspecified site: Secondary | ICD-10-CM | POA: Diagnosis not present

## 2021-03-17 DIAGNOSIS — M8580 Other specified disorders of bone density and structure, unspecified site: Secondary | ICD-10-CM | POA: Diagnosis not present

## 2021-03-17 DIAGNOSIS — Z7189 Other specified counseling: Secondary | ICD-10-CM | POA: Diagnosis not present

## 2021-04-17 DIAGNOSIS — E7800 Pure hypercholesterolemia, unspecified: Secondary | ICD-10-CM | POA: Diagnosis not present

## 2021-04-17 DIAGNOSIS — I10 Essential (primary) hypertension: Secondary | ICD-10-CM | POA: Diagnosis not present

## 2021-04-17 DIAGNOSIS — M8580 Other specified disorders of bone density and structure, unspecified site: Secondary | ICD-10-CM | POA: Diagnosis not present

## 2021-04-17 DIAGNOSIS — M1990 Unspecified osteoarthritis, unspecified site: Secondary | ICD-10-CM | POA: Diagnosis not present

## 2021-05-12 ENCOUNTER — Encounter (INDEPENDENT_AMBULATORY_CARE_PROVIDER_SITE_OTHER): Payer: Self-pay | Admitting: Ophthalmology

## 2021-05-12 ENCOUNTER — Other Ambulatory Visit: Payer: Self-pay

## 2021-05-12 ENCOUNTER — Ambulatory Visit (INDEPENDENT_AMBULATORY_CARE_PROVIDER_SITE_OTHER): Payer: Medicare HMO | Admitting: Ophthalmology

## 2021-05-12 DIAGNOSIS — D3132 Benign neoplasm of left choroid: Secondary | ICD-10-CM

## 2021-05-12 DIAGNOSIS — H43813 Vitreous degeneration, bilateral: Secondary | ICD-10-CM

## 2021-05-12 NOTE — Assessment & Plan Note (Signed)
No change in size year over year

## 2021-05-12 NOTE — Progress Notes (Signed)
05/12/2021     CHIEF COMPLAINT Patient presents for  Chief Complaint  Patient presents with   Retina Follow Up      HISTORY OF PRESENT ILLNESS: Monica Henderson is a 86 y.o. female who presents to the clinic today for:   HPI     Retina Follow Up   Patient presents with  Other.  In both eyes.  This started 1 year ago.  Severity is mild.  Duration of 1 year.  Since onset it is stable.        Comments   1 year fu OU and fp/ B- Scan OS Pt states VA OU stable since last visit. Pt denies FOL, floaters, or ocular pain OU.  Pt states, "I think everything is about the same."       Last edited by [name], [name] on 05/12/2021 10:19 AM.      Referring physician: Leighton Ruff, MD [place],  [place] [address]  HISTORICAL INFORMATION:   Selected notes from the [facility]: No current outpatient medications on file. (Ophthalmic Drugs)   No current facility-administered medications for this visit. (Ophthalmic Drugs)   No current outpatient medications on file. (Other)   No current facility-administered medications for this visit. (Other)      REVIEW OF SYSTEMS:    ALLERGIES Not on File  PAST MEDICAL HISTORY History reviewed. No pertinent past medical history. History reviewed. No pertinent surgical history.  FAMILY HISTORY History reviewed. No pertinent family history.  SOCIAL HISTORY Social History   Tobacco Use   Smoking status: Never   Smokeless tobacco: Never         OPHTHALMIC EXAM:  Base Eye Exam     Visual Acuity (ETDRS)       Right Left   Dist cc 20/25 -2 20/25    Correction: Glasses         Tonometry (Tonopen, 10:22 AM)       Right Left   Pressure 12 12         Pupils       Pupils Dark Light Shape React APD   Right PERRL 4 3 Round Brisk None   Left PERRL 4 3 Round Brisk None         Visual Fields (Counting fingers)       Left Right    Full  Full         Extraocular Movement       Right Left    Full, Ortho Full, Ortho         Neuro/Psych     Oriented x3: Yes         Dilation     Both eyes: 1.0% Mydriacyl, 2.5% Phenylephrine @ 10:22 AM           Slit Lamp and Fundus Exam     External Exam       Right Left   External Normal Normal         Slit Lamp Exam       Right Left   Lids/Lashes Normal Normal   Conjunctiva/Sclera White and quiet White and quiet   Cornea Clear Clear   Anterior Chamber Deep and quiet Deep and quiet   Iris Round and reactive Round and reactive   Lens Centered posterior chamber intraocular lens Centered posterior chamber intraocular lens   Anterior Vitreous Normal Normal  Fundus Exam       Right Left   Posterior Vitreous Posterior vitreous detachment Posterior vitreous detachment   Disc Normal Normal   C/D Ratio 0.2 0.2   Macula Normal Normal   Vessels Normal Normal   Periphery Normal Choroidal nevus in posterior segment, along inferotemporal arcade, 2 disc diameters horizontal and 2.5 disc diameters vertical, no atrophy, no lipofuscin, no subretinal fluid, no appreciable thickness            IMAGING AND PROCEDURES  Imaging and Procedures for 05/12/21  Color Fundus Photography Optos - OU - Both Eyes       Right Eye Progression has been stable. Disc findings include normal observations. Macula : normal observations. Vessels : normal observations. Periphery : normal observations.   Left Eye Progression has been stable. Disc findings include normal observations. Macula : normal observations. Vessels : normal observations.   Notes Choroidal nevus OS, regular pigmented, no vascularity, no subretinal fluid, no atrophy, no appreciable change in size over time, 5 years     B-Scan Ultrasound - OS - Left Eye       Quality was good. Findings included normal observations.   Notes No signs of macular thickening no signs of choroidal thickening from  small nevus posterior pole             ASSESSMENT/PLAN:  Choroidal nevus, left No change in size year over year   Posterior vitreous detachment of both eyes Physiologic OU     ICD-10-CM   1. Choroidal nevus, left  D31.32 Color Fundus Photography Optos - OU - Both Eyes    B-Scan Ultrasound - OS - Left Eye    2. Posterior vitreous detachment of both eyes  H43.813 Color Fundus Photography Optos - OU - Both Eyes      1.  OS, follow-up for choroidal nevus of the posterior pole, interval examination 1 year most recently.  Size and extent of the lesion is unchanged year-over-year and unchanged over the previous 5 years of follow-up in this office.  2.  B-scan confirms no appreciable thickness OS today  3.  Follow-up with Dr. Nathanial Rancher of [facility] as scheduled  Ophthalmic Meds Ordered this visit:  No orders of the defined types were placed in this encounter.      Return in about 1 year (around 05/12/2022) for DILATE OU, COLOR FP, OCT.  There are no Patient Instructions on file for this visit.   Explained the diagnoses, plan, and follow up with the patient and they expressed understanding.  Patient expressed understanding of the importance of proper follow up care.   [name] Diseases & Surgery of the Retina and Vitreous [facility] 05/12/21     Abbreviations: M myopia (nearsighted); A astigmatism; H hyperopia (farsighted); P presbyopia; Mrx spectacle prescription;  CTL contact lenses; OD right eye; OS left eye; OU both eyes  XT exotropia; ET esotropia; PEK punctate epithelial keratitis; PEE punctate epithelial erosions; DES dry eye syndrome; MGD meibomian gland dysfunction; ATs artificial tears; PFAT's preservative free artificial tears; [place] nuclear sclerotic cataract; PSC posterior subcapsular cataract; ERM epi-retinal membrane; PVD posterior vitreous detachment; RD retinal detachment; DM diabetes mellitus; DR diabetic retinopathy;  NPDR non-proliferative diabetic retinopathy; PDR proliferative diabetic retinopathy; CSME clinically significant macular edema; DME diabetic macular edema; dbh dot blot hemorrhages; CWS cotton wool spot; POAG primary open angle glaucoma; C/D cup-to-disc ratio; HVF humphrey visual field; GVF goldmann visual field; OCT optical coherence tomography; IOP  intraocular pressure; BRVO Branch retinal vein occlusion; CRVO central retinal vein occlusion; CRAO central retinal artery occlusion; BRAO branch retinal artery occlusion; RT retinal tear; SB scleral buckle; PPV pars plana vitrectomy; VH Vitreous hemorrhage; PRP panretinal laser photocoagulation; IVK intravitreal kenalog; VMT vitreomacular traction; MH Macular hole;  NVD neovascularization of the disc; NVE neovascularization elsewhere; AREDS age related eye disease study; ARMD age related macular degeneration; POAG primary open angle glaucoma; EBMD epithelial/anterior basement membrane dystrophy; ACIOL anterior chamber intraocular lens; IOL intraocular lens; PCIOL posterior chamber intraocular lens; Phaco/IOL phacoemulsification with intraocular lens placement; [place] photorefractive keratectomy; LASIK laser assisted in situ keratomileusis; HTN hypertension; DM diabetes mellitus; COPD chronic obstructive pulmonary disease

## 2021-05-12 NOTE — Assessment & Plan Note (Signed)
Physiologic OU 

## 2021-05-14 DIAGNOSIS — D225 Melanocytic nevi of trunk: Secondary | ICD-10-CM | POA: Diagnosis not present

## 2021-05-14 DIAGNOSIS — L819 Disorder of pigmentation, unspecified: Secondary | ICD-10-CM | POA: Diagnosis not present

## 2021-05-14 DIAGNOSIS — Z08 Encounter for follow-up examination after completed treatment for malignant neoplasm: Secondary | ICD-10-CM | POA: Diagnosis not present

## 2021-05-14 DIAGNOSIS — L821 Other seborrheic keratosis: Secondary | ICD-10-CM | POA: Diagnosis not present

## 2021-05-14 DIAGNOSIS — L814 Other melanin hyperpigmentation: Secondary | ICD-10-CM | POA: Diagnosis not present

## 2021-05-14 DIAGNOSIS — Z85828 Personal history of other malignant neoplasm of skin: Secondary | ICD-10-CM | POA: Diagnosis not present

## 2021-08-12 DIAGNOSIS — E663 Overweight: Secondary | ICD-10-CM | POA: Diagnosis not present

## 2021-08-12 DIAGNOSIS — R7303 Prediabetes: Secondary | ICD-10-CM | POA: Diagnosis not present

## 2021-08-12 DIAGNOSIS — I10 Essential (primary) hypertension: Secondary | ICD-10-CM | POA: Diagnosis not present

## 2021-08-12 DIAGNOSIS — J309 Allergic rhinitis, unspecified: Secondary | ICD-10-CM | POA: Diagnosis not present

## 2021-08-12 DIAGNOSIS — M8580 Other specified disorders of bone density and structure, unspecified site: Secondary | ICD-10-CM | POA: Diagnosis not present

## 2021-08-12 DIAGNOSIS — E7800 Pure hypercholesterolemia, unspecified: Secondary | ICD-10-CM | POA: Diagnosis not present

## 2021-12-28 DIAGNOSIS — R3989 Other symptoms and signs involving the genitourinary system: Secondary | ICD-10-CM | POA: Diagnosis not present

## 2021-12-28 DIAGNOSIS — R110 Nausea: Secondary | ICD-10-CM | POA: Diagnosis not present

## 2022-01-04 DIAGNOSIS — R110 Nausea: Secondary | ICD-10-CM | POA: Diagnosis not present

## 2022-02-24 DIAGNOSIS — Z23 Encounter for immunization: Secondary | ICD-10-CM | POA: Diagnosis not present

## 2022-02-24 DIAGNOSIS — Z1389 Encounter for screening for other disorder: Secondary | ICD-10-CM | POA: Diagnosis not present

## 2022-02-24 DIAGNOSIS — Z0000 Encounter for general adult medical examination without abnormal findings: Secondary | ICD-10-CM | POA: Diagnosis not present

## 2022-03-02 DIAGNOSIS — M8580 Other specified disorders of bone density and structure, unspecified site: Secondary | ICD-10-CM | POA: Diagnosis not present

## 2022-03-02 DIAGNOSIS — R748 Abnormal levels of other serum enzymes: Secondary | ICD-10-CM | POA: Diagnosis not present

## 2022-03-02 DIAGNOSIS — I10 Essential (primary) hypertension: Secondary | ICD-10-CM | POA: Diagnosis not present

## 2022-03-02 DIAGNOSIS — E7800 Pure hypercholesterolemia, unspecified: Secondary | ICD-10-CM | POA: Diagnosis not present

## 2022-03-02 DIAGNOSIS — N289 Disorder of kidney and ureter, unspecified: Secondary | ICD-10-CM | POA: Diagnosis not present

## 2022-03-02 DIAGNOSIS — R7303 Prediabetes: Secondary | ICD-10-CM | POA: Diagnosis not present

## 2022-03-10 DIAGNOSIS — H00015 Hordeolum externum left lower eyelid: Secondary | ICD-10-CM | POA: Diagnosis not present

## 2022-03-25 ENCOUNTER — Other Ambulatory Visit: Payer: Self-pay | Admitting: Family Medicine

## 2022-03-25 DIAGNOSIS — Z1231 Encounter for screening mammogram for malignant neoplasm of breast: Secondary | ICD-10-CM

## 2022-04-27 DIAGNOSIS — H43813 Vitreous degeneration, bilateral: Secondary | ICD-10-CM | POA: Diagnosis not present

## 2022-04-27 DIAGNOSIS — D3132 Benign neoplasm of left choroid: Secondary | ICD-10-CM | POA: Diagnosis not present

## 2022-05-05 ENCOUNTER — Ambulatory Visit
Admission: RE | Admit: 2022-05-05 | Discharge: 2022-05-05 | Disposition: A | Discharge status: Home / Self Care | Payer: Medicare HMO | Source: Ambulatory Visit | Attending: Family Medicine | Admitting: Family Medicine

## 2022-05-05 DIAGNOSIS — Z1231 Encounter for screening mammogram for malignant neoplasm of breast: Secondary | ICD-10-CM

## 2022-05-13 ENCOUNTER — Encounter (INDEPENDENT_AMBULATORY_CARE_PROVIDER_SITE_OTHER): Payer: Medicare HMO | Admitting: Ophthalmology

## 2022-07-05 DIAGNOSIS — D225 Melanocytic nevi of trunk: Secondary | ICD-10-CM | POA: Diagnosis not present

## 2022-07-05 DIAGNOSIS — L570 Actinic keratosis: Secondary | ICD-10-CM | POA: Diagnosis not present

## 2022-07-05 DIAGNOSIS — L821 Other seborrheic keratosis: Secondary | ICD-10-CM | POA: Diagnosis not present

## 2022-07-05 DIAGNOSIS — L239 Allergic contact dermatitis, unspecified cause: Secondary | ICD-10-CM | POA: Diagnosis not present

## 2022-07-05 DIAGNOSIS — D044 Carcinoma in situ of skin of scalp and neck: Secondary | ICD-10-CM | POA: Diagnosis not present

## 2022-07-05 DIAGNOSIS — L814 Other melanin hyperpigmentation: Secondary | ICD-10-CM | POA: Diagnosis not present

## 2022-07-10 DIAGNOSIS — M5441 Lumbago with sciatica, right side: Secondary | ICD-10-CM | POA: Diagnosis not present

## 2022-07-19 DIAGNOSIS — M5441 Lumbago with sciatica, right side: Secondary | ICD-10-CM | POA: Diagnosis not present

## 2022-07-19 DIAGNOSIS — Z6825 Body mass index (BMI) 25.0-25.9, adult: Secondary | ICD-10-CM | POA: Diagnosis not present

## 2022-07-19 DIAGNOSIS — M130 Polyarthritis, unspecified: Secondary | ICD-10-CM | POA: Diagnosis not present

## 2022-07-19 DIAGNOSIS — M25551 Pain in right hip: Secondary | ICD-10-CM | POA: Diagnosis not present

## 2022-07-26 ENCOUNTER — Other Ambulatory Visit: Payer: Self-pay | Admitting: Family Medicine

## 2022-07-26 ENCOUNTER — Ambulatory Visit
Admission: RE | Admit: 2022-07-26 | Discharge: 2022-07-26 | Disposition: A | Discharge status: Home / Self Care | Payer: Medicare HMO | Source: Ambulatory Visit | Attending: Family Medicine | Admitting: Family Medicine

## 2022-07-26 DIAGNOSIS — M5441 Lumbago with sciatica, right side: Secondary | ICD-10-CM

## 2022-07-26 DIAGNOSIS — M25551 Pain in right hip: Secondary | ICD-10-CM | POA: Diagnosis not present

## 2022-07-26 DIAGNOSIS — M545 Low back pain, unspecified: Secondary | ICD-10-CM | POA: Diagnosis not present

## 2022-08-05 DIAGNOSIS — M5416 Radiculopathy, lumbar region: Secondary | ICD-10-CM | POA: Diagnosis not present

## 2022-08-05 DIAGNOSIS — M25511 Pain in right shoulder: Secondary | ICD-10-CM | POA: Diagnosis not present

## 2022-08-10 DIAGNOSIS — M5416 Radiculopathy, lumbar region: Secondary | ICD-10-CM | POA: Diagnosis not present

## 2022-08-13 DIAGNOSIS — M5416 Radiculopathy, lumbar region: Secondary | ICD-10-CM | POA: Diagnosis not present

## 2022-08-17 DIAGNOSIS — M5416 Radiculopathy, lumbar region: Secondary | ICD-10-CM | POA: Diagnosis not present

## 2022-08-19 DIAGNOSIS — M5416 Radiculopathy, lumbar region: Secondary | ICD-10-CM | POA: Diagnosis not present

## 2022-08-24 DIAGNOSIS — M5416 Radiculopathy, lumbar region: Secondary | ICD-10-CM | POA: Diagnosis not present

## 2022-08-26 DIAGNOSIS — M5416 Radiculopathy, lumbar region: Secondary | ICD-10-CM | POA: Diagnosis not present

## 2022-08-30 DIAGNOSIS — M5416 Radiculopathy, lumbar region: Secondary | ICD-10-CM | POA: Diagnosis not present

## 2022-08-31 DIAGNOSIS — L570 Actinic keratosis: Secondary | ICD-10-CM | POA: Diagnosis not present

## 2022-08-31 DIAGNOSIS — Z08 Encounter for follow-up examination after completed treatment for malignant neoplasm: Secondary | ICD-10-CM | POA: Diagnosis not present

## 2022-08-31 DIAGNOSIS — L821 Other seborrheic keratosis: Secondary | ICD-10-CM | POA: Diagnosis not present

## 2022-08-31 DIAGNOSIS — Z86007 Personal history of in-situ neoplasm of skin: Secondary | ICD-10-CM | POA: Diagnosis not present

## 2022-09-01 DIAGNOSIS — M8580 Other specified disorders of bone density and structure, unspecified site: Secondary | ICD-10-CM | POA: Diagnosis not present

## 2022-09-01 DIAGNOSIS — I10 Essential (primary) hypertension: Secondary | ICD-10-CM | POA: Diagnosis not present

## 2022-09-01 DIAGNOSIS — R7303 Prediabetes: Secondary | ICD-10-CM | POA: Diagnosis not present

## 2022-09-01 DIAGNOSIS — E7800 Pure hypercholesterolemia, unspecified: Secondary | ICD-10-CM | POA: Diagnosis not present

## 2022-09-01 DIAGNOSIS — N289 Disorder of kidney and ureter, unspecified: Secondary | ICD-10-CM | POA: Diagnosis not present

## 2022-09-01 DIAGNOSIS — Z6825 Body mass index (BMI) 25.0-25.9, adult: Secondary | ICD-10-CM | POA: Diagnosis not present

## 2022-09-03 DIAGNOSIS — M5416 Radiculopathy, lumbar region: Secondary | ICD-10-CM | POA: Diagnosis not present

## 2022-09-14 DIAGNOSIS — M5416 Radiculopathy, lumbar region: Secondary | ICD-10-CM | POA: Diagnosis not present

## 2022-09-16 DIAGNOSIS — M5416 Radiculopathy, lumbar region: Secondary | ICD-10-CM | POA: Diagnosis not present

## 2022-09-21 DIAGNOSIS — M5416 Radiculopathy, lumbar region: Secondary | ICD-10-CM | POA: Diagnosis not present

## 2022-09-23 DIAGNOSIS — M5416 Radiculopathy, lumbar region: Secondary | ICD-10-CM | POA: Diagnosis not present

## 2022-09-28 DIAGNOSIS — M5416 Radiculopathy, lumbar region: Secondary | ICD-10-CM | POA: Diagnosis not present

## 2022-09-30 DIAGNOSIS — M5416 Radiculopathy, lumbar region: Secondary | ICD-10-CM | POA: Diagnosis not present

## 2022-10-05 DIAGNOSIS — M5416 Radiculopathy, lumbar region: Secondary | ICD-10-CM | POA: Diagnosis not present

## 2022-10-08 DIAGNOSIS — M5416 Radiculopathy, lumbar region: Secondary | ICD-10-CM | POA: Diagnosis not present

## 2022-10-14 DIAGNOSIS — M5416 Radiculopathy, lumbar region: Secondary | ICD-10-CM | POA: Diagnosis not present

## 2022-10-20 DIAGNOSIS — M5416 Radiculopathy, lumbar region: Secondary | ICD-10-CM | POA: Diagnosis not present

## 2022-10-27 DIAGNOSIS — M5416 Radiculopathy, lumbar region: Secondary | ICD-10-CM | POA: Diagnosis not present

## 2022-11-01 DIAGNOSIS — M5416 Radiculopathy, lumbar region: Secondary | ICD-10-CM | POA: Diagnosis not present

## 2022-11-03 DIAGNOSIS — M5416 Radiculopathy, lumbar region: Secondary | ICD-10-CM | POA: Diagnosis not present

## 2022-11-09 DIAGNOSIS — M5416 Radiculopathy, lumbar region: Secondary | ICD-10-CM | POA: Diagnosis not present

## 2022-11-11 DIAGNOSIS — Z961 Presence of intraocular lens: Secondary | ICD-10-CM | POA: Diagnosis not present

## 2022-11-11 DIAGNOSIS — H04123 Dry eye syndrome of bilateral lacrimal glands: Secondary | ICD-10-CM | POA: Diagnosis not present

## 2022-11-11 DIAGNOSIS — H43813 Vitreous degeneration, bilateral: Secondary | ICD-10-CM | POA: Diagnosis not present

## 2022-11-11 DIAGNOSIS — H10413 Chronic giant papillary conjunctivitis, bilateral: Secondary | ICD-10-CM | POA: Diagnosis not present

## 2022-11-11 DIAGNOSIS — D3132 Benign neoplasm of left choroid: Secondary | ICD-10-CM | POA: Diagnosis not present

## 2022-11-11 DIAGNOSIS — H353131 Nonexudative age-related macular degeneration, bilateral, early dry stage: Secondary | ICD-10-CM | POA: Diagnosis not present

## 2022-11-18 DIAGNOSIS — M5416 Radiculopathy, lumbar region: Secondary | ICD-10-CM | POA: Diagnosis not present

## 2022-11-25 DIAGNOSIS — M5416 Radiculopathy, lumbar region: Secondary | ICD-10-CM | POA: Diagnosis not present

## 2022-12-02 DIAGNOSIS — M5416 Radiculopathy, lumbar region: Secondary | ICD-10-CM | POA: Diagnosis not present

## 2022-12-06 DIAGNOSIS — D492 Neoplasm of unspecified behavior of bone, soft tissue, and skin: Secondary | ICD-10-CM | POA: Diagnosis not present

## 2022-12-06 DIAGNOSIS — L570 Actinic keratosis: Secondary | ICD-10-CM | POA: Diagnosis not present

## 2022-12-06 DIAGNOSIS — L821 Other seborrheic keratosis: Secondary | ICD-10-CM | POA: Diagnosis not present

## 2022-12-07 DIAGNOSIS — M5416 Radiculopathy, lumbar region: Secondary | ICD-10-CM | POA: Diagnosis not present

## 2022-12-13 DIAGNOSIS — H6993 Unspecified Eustachian tube disorder, bilateral: Secondary | ICD-10-CM | POA: Diagnosis not present

## 2022-12-13 DIAGNOSIS — R0981 Nasal congestion: Secondary | ICD-10-CM | POA: Diagnosis not present

## 2022-12-13 DIAGNOSIS — R059 Cough, unspecified: Secondary | ICD-10-CM | POA: Diagnosis not present

## 2022-12-13 DIAGNOSIS — J329 Chronic sinusitis, unspecified: Secondary | ICD-10-CM | POA: Diagnosis not present

## 2022-12-13 DIAGNOSIS — M5416 Radiculopathy, lumbar region: Secondary | ICD-10-CM | POA: Diagnosis not present

## 2022-12-13 DIAGNOSIS — H6121 Impacted cerumen, right ear: Secondary | ICD-10-CM | POA: Diagnosis not present

## 2023-03-17 ENCOUNTER — Other Ambulatory Visit: Payer: Self-pay | Admitting: Family Medicine

## 2023-03-17 DIAGNOSIS — M8580 Other specified disorders of bone density and structure, unspecified site: Secondary | ICD-10-CM

## 2023-03-17 DIAGNOSIS — Z9989 Dependence on other enabling machines and devices: Secondary | ICD-10-CM | POA: Diagnosis not present

## 2023-03-17 DIAGNOSIS — Z0000 Encounter for general adult medical examination without abnormal findings: Secondary | ICD-10-CM | POA: Diagnosis not present

## 2023-03-17 DIAGNOSIS — E7800 Pure hypercholesterolemia, unspecified: Secondary | ICD-10-CM | POA: Diagnosis not present

## 2023-03-17 DIAGNOSIS — I10 Essential (primary) hypertension: Secondary | ICD-10-CM | POA: Diagnosis not present

## 2023-03-17 DIAGNOSIS — Z23 Encounter for immunization: Secondary | ICD-10-CM | POA: Diagnosis not present

## 2023-03-17 DIAGNOSIS — R7303 Prediabetes: Secondary | ICD-10-CM | POA: Diagnosis not present

## 2023-04-11 ENCOUNTER — Other Ambulatory Visit: Payer: Self-pay | Admitting: Family Medicine

## 2023-04-11 DIAGNOSIS — Z1231 Encounter for screening mammogram for malignant neoplasm of breast: Secondary | ICD-10-CM

## 2023-04-27 DIAGNOSIS — H43813 Vitreous degeneration, bilateral: Secondary | ICD-10-CM | POA: Diagnosis not present

## 2023-04-27 DIAGNOSIS — H02833 Dermatochalasis of right eye, unspecified eyelid: Secondary | ICD-10-CM | POA: Diagnosis not present

## 2023-04-27 DIAGNOSIS — H02836 Dermatochalasis of left eye, unspecified eyelid: Secondary | ICD-10-CM | POA: Diagnosis not present

## 2023-04-27 DIAGNOSIS — D3132 Benign neoplasm of left choroid: Secondary | ICD-10-CM | POA: Diagnosis not present

## 2023-05-09 ENCOUNTER — Ambulatory Visit
Admission: RE | Admit: 2023-05-09 | Discharge: 2023-05-09 | Disposition: A | Discharge status: Home / Self Care | Payer: Medicare HMO | Source: Ambulatory Visit | Attending: Family Medicine | Admitting: Family Medicine

## 2023-05-09 DIAGNOSIS — L814 Other melanin hyperpigmentation: Secondary | ICD-10-CM | POA: Diagnosis not present

## 2023-05-09 DIAGNOSIS — D225 Melanocytic nevi of trunk: Secondary | ICD-10-CM | POA: Diagnosis not present

## 2023-05-09 DIAGNOSIS — Z08 Encounter for follow-up examination after completed treatment for malignant neoplasm: Secondary | ICD-10-CM | POA: Diagnosis not present

## 2023-05-09 DIAGNOSIS — Z1231 Encounter for screening mammogram for malignant neoplasm of breast: Secondary | ICD-10-CM

## 2023-05-09 DIAGNOSIS — Z85828 Personal history of other malignant neoplasm of skin: Secondary | ICD-10-CM | POA: Diagnosis not present

## 2023-05-09 DIAGNOSIS — L821 Other seborrheic keratosis: Secondary | ICD-10-CM | POA: Diagnosis not present

## 2023-05-26 DIAGNOSIS — M5416 Radiculopathy, lumbar region: Secondary | ICD-10-CM | POA: Diagnosis not present

## 2023-05-31 DIAGNOSIS — M5416 Radiculopathy, lumbar region: Secondary | ICD-10-CM | POA: Diagnosis not present

## 2023-06-02 DIAGNOSIS — M5416 Radiculopathy, lumbar region: Secondary | ICD-10-CM | POA: Diagnosis not present

## 2023-06-09 DIAGNOSIS — J069 Acute upper respiratory infection, unspecified: Secondary | ICD-10-CM | POA: Diagnosis not present

## 2023-06-09 DIAGNOSIS — Z6826 Body mass index (BMI) 26.0-26.9, adult: Secondary | ICD-10-CM | POA: Diagnosis not present

## 2023-06-14 DIAGNOSIS — M5416 Radiculopathy, lumbar region: Secondary | ICD-10-CM | POA: Diagnosis not present

## 2023-06-16 DIAGNOSIS — M5416 Radiculopathy, lumbar region: Secondary | ICD-10-CM | POA: Diagnosis not present

## 2023-06-23 DIAGNOSIS — M5416 Radiculopathy, lumbar region: Secondary | ICD-10-CM | POA: Diagnosis not present

## 2023-06-30 DIAGNOSIS — M5416 Radiculopathy, lumbar region: Secondary | ICD-10-CM | POA: Diagnosis not present

## 2023-07-07 DIAGNOSIS — M5416 Radiculopathy, lumbar region: Secondary | ICD-10-CM | POA: Diagnosis not present

## 2023-07-12 DIAGNOSIS — M5416 Radiculopathy, lumbar region: Secondary | ICD-10-CM | POA: Diagnosis not present

## 2023-07-14 DIAGNOSIS — M5416 Radiculopathy, lumbar region: Secondary | ICD-10-CM | POA: Diagnosis not present

## 2023-07-19 DIAGNOSIS — M5416 Radiculopathy, lumbar region: Secondary | ICD-10-CM | POA: Diagnosis not present

## 2023-07-22 DIAGNOSIS — M5416 Radiculopathy, lumbar region: Secondary | ICD-10-CM | POA: Diagnosis not present

## 2023-07-25 ENCOUNTER — Other Ambulatory Visit: Payer: Self-pay | Admitting: Family Medicine

## 2023-07-25 DIAGNOSIS — Z0000 Encounter for general adult medical examination without abnormal findings: Secondary | ICD-10-CM

## 2023-07-26 DIAGNOSIS — M5416 Radiculopathy, lumbar region: Secondary | ICD-10-CM | POA: Diagnosis not present

## 2023-08-26 DIAGNOSIS — M5416 Radiculopathy, lumbar region: Secondary | ICD-10-CM | POA: Diagnosis not present

## 2023-09-21 DIAGNOSIS — Z23 Encounter for immunization: Secondary | ICD-10-CM | POA: Diagnosis not present

## 2023-09-21 DIAGNOSIS — I10 Essential (primary) hypertension: Secondary | ICD-10-CM | POA: Diagnosis not present

## 2023-09-21 DIAGNOSIS — E7800 Pure hypercholesterolemia, unspecified: Secondary | ICD-10-CM | POA: Diagnosis not present

## 2023-09-21 DIAGNOSIS — R7303 Prediabetes: Secondary | ICD-10-CM | POA: Diagnosis not present

## 2023-11-08 DIAGNOSIS — L821 Other seborrheic keratosis: Secondary | ICD-10-CM | POA: Diagnosis not present

## 2023-11-08 DIAGNOSIS — D492 Neoplasm of unspecified behavior of bone, soft tissue, and skin: Secondary | ICD-10-CM | POA: Diagnosis not present

## 2023-11-08 DIAGNOSIS — L570 Actinic keratosis: Secondary | ICD-10-CM | POA: Diagnosis not present

## 2023-11-08 DIAGNOSIS — Z08 Encounter for follow-up examination after completed treatment for malignant neoplasm: Secondary | ICD-10-CM | POA: Diagnosis not present

## 2023-11-08 DIAGNOSIS — D225 Melanocytic nevi of trunk: Secondary | ICD-10-CM | POA: Diagnosis not present

## 2023-11-08 DIAGNOSIS — Z85828 Personal history of other malignant neoplasm of skin: Secondary | ICD-10-CM | POA: Diagnosis not present

## 2023-11-08 DIAGNOSIS — L814 Other melanin hyperpigmentation: Secondary | ICD-10-CM | POA: Diagnosis not present

## 2023-11-18 ENCOUNTER — Inpatient Hospital Stay: Admission: RE | Admit: 2023-11-18 | Payer: Medicare HMO | Source: Ambulatory Visit

## 2023-11-29 DIAGNOSIS — M48062 Spinal stenosis, lumbar region with neurogenic claudication: Secondary | ICD-10-CM | POA: Diagnosis not present

## 2023-11-29 DIAGNOSIS — M5431 Sciatica, right side: Secondary | ICD-10-CM | POA: Diagnosis not present

## 2023-11-29 DIAGNOSIS — Z1330 Encounter for screening examination for mental health and behavioral disorders, unspecified: Secondary | ICD-10-CM | POA: Diagnosis not present

## 2023-12-01 DIAGNOSIS — M47816 Spondylosis without myelopathy or radiculopathy, lumbar region: Secondary | ICD-10-CM | POA: Diagnosis not present

## 2023-12-01 DIAGNOSIS — M4807 Spinal stenosis, lumbosacral region: Secondary | ICD-10-CM | POA: Diagnosis not present

## 2023-12-01 DIAGNOSIS — M51372 Other intervertebral disc degeneration, lumbosacral region with discogenic back pain and lower extremity pain: Secondary | ICD-10-CM | POA: Diagnosis not present

## 2023-12-01 DIAGNOSIS — M47817 Spondylosis without myelopathy or radiculopathy, lumbosacral region: Secondary | ICD-10-CM | POA: Diagnosis not present

## 2023-12-01 DIAGNOSIS — M51362 Other intervertebral disc degeneration, lumbar region with discogenic back pain and lower extremity pain: Secondary | ICD-10-CM | POA: Diagnosis not present

## 2023-12-01 DIAGNOSIS — M419 Scoliosis, unspecified: Secondary | ICD-10-CM | POA: Diagnosis not present

## 2023-12-01 DIAGNOSIS — M48061 Spinal stenosis, lumbar region without neurogenic claudication: Secondary | ICD-10-CM | POA: Diagnosis not present

## 2023-12-01 DIAGNOSIS — M5126 Other intervertebral disc displacement, lumbar region: Secondary | ICD-10-CM | POA: Diagnosis not present

## 2023-12-14 DIAGNOSIS — Z13820 Encounter for screening for osteoporosis: Secondary | ICD-10-CM | POA: Diagnosis not present

## 2023-12-14 DIAGNOSIS — M8580 Other specified disorders of bone density and structure, unspecified site: Secondary | ICD-10-CM | POA: Diagnosis not present

## 2024-01-03 DIAGNOSIS — M48062 Spinal stenosis, lumbar region with neurogenic claudication: Secondary | ICD-10-CM | POA: Diagnosis not present

## 2024-01-03 DIAGNOSIS — M5431 Sciatica, right side: Secondary | ICD-10-CM | POA: Diagnosis not present

## 2024-01-03 DIAGNOSIS — M5116 Intervertebral disc disorders with radiculopathy, lumbar region: Secondary | ICD-10-CM | POA: Diagnosis not present

## 2024-03-21 DIAGNOSIS — Z0000 Encounter for general adult medical examination without abnormal findings: Secondary | ICD-10-CM | POA: Diagnosis not present

## 2024-03-21 DIAGNOSIS — Z23 Encounter for immunization: Secondary | ICD-10-CM | POA: Diagnosis not present

## 2024-03-21 DIAGNOSIS — Z1331 Encounter for screening for depression: Secondary | ICD-10-CM | POA: Diagnosis not present

## 2024-03-22 DIAGNOSIS — M5116 Intervertebral disc disorders with radiculopathy, lumbar region: Secondary | ICD-10-CM | POA: Diagnosis not present

## 2024-03-22 DIAGNOSIS — M5431 Sciatica, right side: Secondary | ICD-10-CM | POA: Diagnosis not present

## 2024-03-28 DIAGNOSIS — E663 Overweight: Secondary | ICD-10-CM | POA: Diagnosis not present

## 2024-03-28 DIAGNOSIS — H9113 Presbycusis, bilateral: Secondary | ICD-10-CM | POA: Diagnosis not present

## 2024-03-28 DIAGNOSIS — I10 Essential (primary) hypertension: Secondary | ICD-10-CM | POA: Diagnosis not present

## 2024-03-28 DIAGNOSIS — R7303 Prediabetes: Secondary | ICD-10-CM | POA: Diagnosis not present

## 2024-03-28 DIAGNOSIS — Z9989 Dependence on other enabling machines and devices: Secondary | ICD-10-CM | POA: Diagnosis not present

## 2024-03-28 DIAGNOSIS — M47816 Spondylosis without myelopathy or radiculopathy, lumbar region: Secondary | ICD-10-CM | POA: Diagnosis not present

## 2024-03-28 DIAGNOSIS — E7800 Pure hypercholesterolemia, unspecified: Secondary | ICD-10-CM | POA: Diagnosis not present

## 2024-03-28 DIAGNOSIS — Z6826 Body mass index (BMI) 26.0-26.9, adult: Secondary | ICD-10-CM | POA: Diagnosis not present

## 2024-05-07 DIAGNOSIS — H353132 Nonexudative age-related macular degeneration, bilateral, intermediate dry stage: Secondary | ICD-10-CM | POA: Diagnosis not present

## 2024-05-07 DIAGNOSIS — H02833 Dermatochalasis of right eye, unspecified eyelid: Secondary | ICD-10-CM | POA: Diagnosis not present

## 2024-05-07 DIAGNOSIS — L720 Epidermal cyst: Secondary | ICD-10-CM | POA: Diagnosis not present

## 2024-05-07 DIAGNOSIS — L814 Other melanin hyperpigmentation: Secondary | ICD-10-CM | POA: Diagnosis not present

## 2024-05-07 DIAGNOSIS — D3132 Benign neoplasm of left choroid: Secondary | ICD-10-CM | POA: Diagnosis not present

## 2024-05-07 DIAGNOSIS — D225 Melanocytic nevi of trunk: Secondary | ICD-10-CM | POA: Diagnosis not present

## 2024-05-07 DIAGNOSIS — H02836 Dermatochalasis of left eye, unspecified eyelid: Secondary | ICD-10-CM | POA: Diagnosis not present

## 2024-05-07 DIAGNOSIS — H43813 Vitreous degeneration, bilateral: Secondary | ICD-10-CM | POA: Diagnosis not present

## 2024-05-07 DIAGNOSIS — L821 Other seborrheic keratosis: Secondary | ICD-10-CM | POA: Diagnosis not present

## 2024-05-09 ENCOUNTER — Ambulatory Visit: Payer: Medicare HMO

## 2024-05-09 DIAGNOSIS — Z1231 Encounter for screening mammogram for malignant neoplasm of breast: Secondary | ICD-10-CM | POA: Diagnosis not present

## 2024-05-18 ENCOUNTER — Other Ambulatory Visit: Payer: Self-pay | Admitting: Family Medicine

## 2024-05-18 DIAGNOSIS — R928 Other abnormal and inconclusive findings on diagnostic imaging of breast: Secondary | ICD-10-CM

## 2024-05-23 ENCOUNTER — Inpatient Hospital Stay: Admission: RE | Admit: 2024-05-23 | Discharge: 2024-05-23 | Attending: Family Medicine | Admitting: Family Medicine

## 2024-05-23 ENCOUNTER — Other Ambulatory Visit: Payer: Self-pay | Admitting: Family Medicine

## 2024-05-23 DIAGNOSIS — N6323 Unspecified lump in the left breast, lower outer quadrant: Secondary | ICD-10-CM | POA: Diagnosis not present

## 2024-05-23 DIAGNOSIS — R928 Other abnormal and inconclusive findings on diagnostic imaging of breast: Secondary | ICD-10-CM

## 2024-05-28 ENCOUNTER — Ambulatory Visit
Admission: RE | Admit: 2024-05-28 | Discharge: 2024-05-28 | Disposition: A | Discharge status: Home / Self Care | Source: Ambulatory Visit | Attending: Family Medicine | Admitting: Family Medicine

## 2024-05-28 ENCOUNTER — Inpatient Hospital Stay: Admission: RE | Admit: 2024-05-28 | Discharge: 2024-05-28 | Attending: Family Medicine | Admitting: Family Medicine

## 2024-05-28 DIAGNOSIS — N6323 Unspecified lump in the left breast, lower outer quadrant: Secondary | ICD-10-CM

## 2024-05-28 DIAGNOSIS — C50512 Malignant neoplasm of lower-outer quadrant of left female breast: Secondary | ICD-10-CM | POA: Diagnosis not present

## 2024-05-28 HISTORY — PX: BREAST BIOPSY: SHX20

## 2024-05-29 LAB — SURGICAL PATHOLOGY

## 2024-06-11 ENCOUNTER — Other Ambulatory Visit: Payer: Self-pay | Admitting: Surgery

## 2024-06-11 DIAGNOSIS — Z853 Personal history of malignant neoplasm of breast: Secondary | ICD-10-CM

## 2024-06-13 ENCOUNTER — Other Ambulatory Visit: Payer: Self-pay | Admitting: Surgery

## 2024-06-13 DIAGNOSIS — Z853 Personal history of malignant neoplasm of breast: Secondary | ICD-10-CM

## 2024-06-18 ENCOUNTER — Encounter (HOSPITAL_COMMUNITY): Payer: Self-pay

## 2024-06-18 NOTE — Pre-Procedure Instructions (Signed)
 Surgical Instructions   Your procedure is scheduled on June 21, 2024. Report to [facility] Main Entrance A at 10:40 A.M., then check in with the Admitting office. Any questions or running late day of surgery: call [phone]  Questions prior to your surgery date: call [phone], Monday-Friday, 8am-4pm. If you experience any cold or flu symptoms such as cough, fever, chills, shortness of breath, etc. between now and your scheduled surgery, please notify us  at the above number.     Remember:  Do not eat after midnight the night before your surgery   You may drink clear liquids until 9:40 AM the morning of your surgery.   Clear liquids allowed are: Water, Non-Citrus Juices (without pulp), Carbonated Beverages, Clear Tea (no milk, honey, etc.), Black Coffee Only (NO MILK, CREAM OR POWDERED CREAMER of any kind), and Gatorade.  Patient Instructions  The night before surgery:  No food after midnight. ONLY clear liquids after midnight  The day of surgery (if you do NOT have diabetes):  Drink ONE (1) Pre-Surgery Clear Ensure by 9:40 AM the morning of surgery. Drink in one sitting. Do not sip.  This drink was given to you during your hospital  pre-op appointment visit.  Nothing else to drink after completing the  Pre-Surgery Clear Ensure.         If you have questions, please contact your surgeons office.    Take these medicines the morning of surgery with A SIP OF WATER: amLODipine (NORVASC)    May take these medicines IF NEEDED: acetaminophen (TYLENOL)  gabapentin (NEURONTIN)  sodium chloride (OCEAN) nasal spray    One week prior to surgery, STOP taking any Aspirin (unless otherwise instructed by your surgeon) Aleve, Naproxen, Ibuprofen, Motrin, Advil, Goody's, BC's, all herbal medications, fish oil, and non-prescription vitamins.                     Do NOT Smoke (Tobacco/Vaping) for 24 hours prior to your procedure.  If you use a CPAP at night, you may bring your  mask/headgear for your overnight stay.   You will be asked to remove any contacts, glasses, piercing's, hearing aid's, dentures/partials prior to surgery. Please bring cases for these items if needed.    Patients discharged the day of surgery will not be allowed to drive home, and someone needs to stay with them for 24 hours.  SURGICAL WAITING ROOM VISITATION Patients may have no more than 2 support people in the waiting area - these visitors may rotate.   Pre-op nurse will coordinate an appropriate time for 1 ADULT support person, who may not rotate, to accompany patient in pre-op.  Children under the age of 21 must have an adult with them who is not the patient and must remain in the main waiting area with an adult.  If the patient needs to stay at the hospital during part of their recovery, the visitor guidelines for inpatient rooms apply.  Please refer to the [facility] website for the visitor guidelines for any additional information.   If you received a COVID test during your pre-op visit  it is requested that you wear a mask when out in public, stay away from anyone that may not be feeling well and notify your surgeon if you develop symptoms. If you have been in contact with anyone that has tested positive in the last 10 days please notify you surgeon.      Pre-operative CHG Bathing Instructions   You can play a  key role in reducing the risk of infection after surgery. Your skin needs to be as free of germs as possible. You can reduce the number of germs on your skin by washing with CHG (chlorhexidine gluconate) soap before surgery. CHG is an antiseptic soap that kills germs and continues to kill germs even after washing.   DO NOT use if you have an allergy to chlorhexidine/CHG or antibacterial soaps. If your skin becomes reddened or irritated, stop using the CHG and notify one of our RNs at [phone].              TAKE A SHOWER THE NIGHT BEFORE SURGERY   Please keep in mind  the following:  DO NOT shave, including legs and underarms, 48 hours prior to surgery.   You may shave your face before/day of surgery.  Place clean sheets on your bed the night before surgery Use a clean washcloth (not used since being washed) for shower. DO NOT sleep with pet's night before surgery.  CHG Shower Instructions:  Wash your face and private area with normal soap. If you choose to wash your hair, wash first with your normal shampoo.  After you use shampoo/soap, rinse your hair and body thoroughly to remove shampoo/soap residue.  Turn the water OFF and apply half the bottle of CHG soap to a CLEAN washcloth.  Apply CHG soap ONLY FROM YOUR NECK DOWN TO YOUR TOES (washing for 3-5 minutes)  DO NOT use CHG soap on face, private areas, open wounds, or sores.  Pay special attention to the area where your surgery is being performed.  If you are having back surgery, having someone wash your back for you may be helpful. Wait 2 minutes after CHG soap is applied, then you may rinse off the CHG soap.  Pat dry with a clean towel  Put on clean pajamas    Additional instructions for the day of surgery: If you choose, you may shower the morning of surgery with an antibacterial soap.  DO NOT APPLY any lotions, deodorants, cologne, or perfumes.   Do not wear jewelry or makeup Do not wear nail polish, gel polish, artificial nails, or any other type of covering on natural nails (fingers and toes) Do not bring valuables to the hospital. [facility] is not responsible for valuables/personal belongings. Put on clean/comfortable clothes.  Please brush your teeth.  Ask your nurse before applying any prescription medications to the skin.

## 2024-06-19 ENCOUNTER — Other Ambulatory Visit: Payer: Self-pay

## 2024-06-19 ENCOUNTER — Encounter (HOSPITAL_COMMUNITY)
Admission: RE | Admit: 2024-06-19 | Discharge: 2024-06-19 | Disposition: A | Discharge status: Home / Self Care | Source: Ambulatory Visit | Attending: Surgery | Admitting: Surgery

## 2024-06-19 ENCOUNTER — Ambulatory Visit
Admission: RE | Admit: 2024-06-19 | Discharge: 2024-06-19 | Disposition: A | Discharge status: Home / Self Care | Source: Ambulatory Visit | Attending: Surgery | Admitting: Surgery

## 2024-06-19 ENCOUNTER — Inpatient Hospital Stay: Admission: RE | Admit: 2024-06-19 | Discharge: 2024-06-19 | Attending: Surgery | Admitting: Surgery

## 2024-06-19 VITALS — BP 120/67 | HR 76 | Temp 97.8°F | Resp 16 | Ht 64.0 in | Wt 150.3 lb

## 2024-06-19 DIAGNOSIS — Z853 Personal history of malignant neoplasm of breast: Secondary | ICD-10-CM

## 2024-06-19 DIAGNOSIS — Z01818 Encounter for other preprocedural examination: Secondary | ICD-10-CM | POA: Diagnosis not present

## 2024-06-19 DIAGNOSIS — Z01810 Encounter for preprocedural cardiovascular examination: Secondary | ICD-10-CM | POA: Diagnosis present

## 2024-06-19 DIAGNOSIS — Z01812 Encounter for preprocedural laboratory examination: Secondary | ICD-10-CM | POA: Diagnosis present

## 2024-06-19 HISTORY — PX: BREAST BIOPSY: SHX20

## 2024-06-19 HISTORY — DX: Other intervertebral disc degeneration, lumbar region without mention of lumbar back pain or lower extremity pain: M51.369

## 2024-06-19 HISTORY — DX: Prediabetes: R73.03

## 2024-06-19 HISTORY — DX: Unspecified osteoarthritis, unspecified site: M19.90

## 2024-06-19 HISTORY — DX: Family history of other specified conditions: Z84.89

## 2024-06-19 HISTORY — DX: Essential (primary) hypertension: I10

## 2024-06-19 LAB — BASIC METABOLIC PANEL WITH GFR
Anion gap: 10 (ref 5–15)
BUN: 18 mg/dL (ref 8–23)
CO2: 31 mmol/L (ref 22–32)
Calcium: 9.6 mg/dL (ref 8.9–10.3)
Chloride: 103 mmol/L (ref 98–111)
Creatinine, Ser: 0.81 mg/dL (ref 0.44–1.00)
GFR, Estimated: >60 mL/min
Glucose, Bld: 149 mg/dL — ABNORMAL HIGH (ref 70–99)
Potassium: 4 mmol/L (ref 3.5–5.1)
Sodium: 143 mmol/L (ref 135–145)

## 2024-06-19 LAB — CBC
HCT: 40.5 % (ref 36.0–46.0)
Hemoglobin: 13.4 g/dL (ref 12.0–15.0)
MCH: 31.1 pg (ref 26.0–34.0)
MCHC: 33.1 g/dL (ref 30.0–36.0)
MCV: 94 fL (ref 80.0–100.0)
Platelets: 233 K/uL (ref 150–400)
RBC: 4.31 MIL/uL (ref 3.87–5.11)
RDW: 11.9 % (ref 11.5–15.5)
WBC: 6.4 K/uL (ref 4.0–10.5)
nRBC: 0 % (ref 0.0–0.2)

## 2024-06-19 NOTE — Progress Notes (Signed)
 " Radiation Oncology         [phone]) [phone] ________________________________  Initial Outpatient Consultation  Name: Monica Henderson MRN: [id number]  Date: 06/20/2024  DOB: 12-30-1934  RR:Fpoozm, Olam, MD  Vernetta Berg, MD   REFERRING PHYSICIAN: Vernetta Berg, MD  DIAGNOSIS: There were no encounter diagnoses.  Left breast invasive ductal carcinoma ER+, PR+, Her2 negative  HISTORY OF PRESENT ILLNESS::Monica Henderson is a 89 y.o. female who is accompanied by ***. she is seen as a courtesy of Dr. Vernetta for an opinion concerning radiation therapy as part of management for her recently diagnosed breast cancer.   The patient underwent a screening bilateral mammogram on 05/08/24 showing a small mass. As a result she underwent a diagnostic mammogram on 05/23/24 showing at 5 o'clock 5 cm from the nipple, there is an irregular hypoechoic mass with irregular margins. This measures 6 by 9 x 7 mm. This corresponds to the site of mammographic concern. Adjacent at 5 o'clock 6 cm from the nipple, there is an additional oval hypoechoic mass with angular margins. This measures 4 x 6 x 3 mm. This is approximately 9 mm from the margin of the dominant mass.   In light of findings, she then underwent a left breast core biopsy on 05/28/24 of the 5 oclock lesion. Pathology indicated invasive ductal carcinoma wit tumor size of 0.7 cm. Prognostic indicators significant for: estrogen receptor 100%, positive, strong staining intensity; progesterone receptor 100%, positive, strong staining intensity; Proliferation marker Ki67 at 5%; Her2 status negative;    Subsequently, she was referred to Dr. Vernetta to discuss further treatment plan. Upon discussion, they opted to proceed with a left breast lumpectomy with radioactive seed localization.   PREVIOUS RADIATION THERAPY: No  PAST MEDICAL HISTORY:  Past Medical History:  Diagnosis Date   Hypertension     PAST SURGICAL HISTORY: Past Surgical  History:  Procedure Laterality Date   BREAST BIOPSY Left 05/28/2024   US  LT BREAST BX W LOC DEV 1ST LESION IMG BX SPEC US  GUIDE 05/28/2024 [facility]   BREAST BIOPSY Left 06/19/2024   US  LT RADIOACTIVE SEED LOC 06/19/2024 [facility]    FAMILY HISTORY:  Family History  Problem Relation Age of Onset   Breast cancer Neg Hx     SOCIAL HISTORY: Social History[1]  ALLERGIES: Allergies[2]  MEDICATIONS:  Current Outpatient Medications  Medication Sig Dispense Refill   acetaminophen (TYLENOL) 500 MG tablet Take 500 mg by mouth every 6 (six) hours as needed for moderate pain (pain score 4-6).     amLODipine (NORVASC) 5 MG tablet Take 5 mg by mouth daily.     gabapentin (NEURONTIN) 100 MG capsule Take 100 mg by mouth at bedtime as needed (sciatic nerve pain).     KLOR-CON M20 20 MEQ tablet Take 20 mEq by mouth daily.     sodium chloride (OCEAN) 0.65 % SOLN nasal spray Place 1 spray into both nostrils as needed for congestion.     triamcinolone cream (KENALOG) 0.1 % Apply 1 Application topically daily as needed (irritation).     triamterene-hydrochlorothiazide (MAXZIDE-25) 37.5-25 MG tablet Take 1 tablet by mouth daily.     No current facility-administered medications for this visit.    REVIEW OF SYSTEMS:  A 10+ POINT REVIEW OF SYSTEMS WAS OBTAINED including neurology, dermatology, psychiatry, cardiac, respiratory, lymph, extremities, GI, GU, musculoskeletal, constitutional, reproductive, HEENT. ***   PHYSICAL EXAM:  vitals were not taken for this visit.   General: Alert and oriented, in no acute  distress HEENT: Head is normocephalic. Extraocular movements are intact. Oropharynx is clear. Neck: Neck is supple, no palpable cervical or supraclavicular lymphadenopathy. Heart: Regular in rate and rhythm with no murmurs, rubs, or gallops. Chest: Clear to auscultation bilaterally, with no rhonchi, wheezes, or rales. Abdomen: Soft, nontender, nondistended, with no rigidity or  guarding. Extremities: No cyanosis or edema. Lymphatics: see Neck Exam Skin: No concerning lesions. Musculoskeletal: symmetric strength and muscle tone throughout. Neurologic: Cranial nerves II through XII are grossly intact. No obvious focalities. Speech is fluent. Coordination is intact. Psychiatric: Judgment and insight are intact. Affect is appropriate. ***  ECOG = ***  0 - Asymptomatic (Fully active, able to carry on all predisease activities without restriction)  1 - Symptomatic but completely ambulatory (Restricted in physically strenuous activity but ambulatory and able to carry out work of a light or sedentary nature. For example, light housework, office work)  2 - Symptomatic, <50% in bed during the day (Ambulatory and capable of all self care but unable to carry out any work activities. Up and about more than 50% of waking hours)  3 - Symptomatic, >50% in bed, but not bedbound (Capable of only limited self-care, confined to bed or chair 50% or more of waking hours)  4 - Bedbound (Completely disabled. Cannot carry on any self-care. Totally confined to bed or chair)  5 - Death   Raylene MM, Creech RH, Tormey DC, et al. [phone]). Toxicity and response criteria of the [facility]. Am. DOROTHA Bridges. Oncol. 5 (6): 649-55  LABORATORY DATA:  Lab Results  Component Value Date   WBC 7.5 06/11/2009   HGB 14.2 06/11/2009   HCT 41.6 06/11/2009   MCV 92.0 06/11/2009   PLT 238 06/11/2009   NEUTROABS 4.3 06/11/2009   Lab Results  Component Value Date   NA 141 06/11/2009   K 3.5 06/11/2009   CL 101 06/11/2009   CO2 25 06/11/2009   GLUCOSE 98 06/11/2009   BUN 16 06/11/2009   CREATININE 0.68 06/11/2009   CALCIUM 9.6 06/11/2009      RADIOGRAPHY: MM CLIP PLACEMENT LEFT Result Date: 06/19/2024 CLINICAL DATA:  Status post radioactive seed localization of LEFT breast mass. EXAM: DIAGNOSTIC LEFT MAMMOGRAM POST ULTRASOUND-GUIDED RADIOACTIVE SEED PLACEMENT COMPARISON:   Previous exam(s). ACR Breast Density Category b: There are scattered areas of fibroglandular density. FINDINGS: Mammographic images were obtained following ultrasound-guided radioactive seed placement. These demonstrate appropriate positioning of radioactive seed within biopsy-proven LEFT breast malignancy. IMPRESSION: Appropriate location of the radioactive seed. Final Assessment: Post Procedure Mammograms for Seed Placement BI-RADS CATEGORY  38M: Post-Procedure Mammogram for Marker Placement Electronically Signed   By: Aliene Lloyd M.D.   On: 06/19/2024 09:36   US  LT RADIOACTIVE SEED LOC Result Date: 06/19/2024 CLINICAL DATA:  89 year old woman with invasive ductal carcinoma of the LEFT breast presents for radioactive seed localization prior to lumpectomy. EXAM: ULTRASOUND GUIDED RADIOACTIVE SEED LOCALIZATION OF THE LEFT BREAST COMPARISON:  Previous exam(s). FINDINGS: Patient presents for radioactive seed localization prior to LEFT breast lumpectomy. I met with the patient and we discussed the procedure of seed localization including benefits and alternatives. We discussed the high likelihood of a successful procedure. We discussed the risks of the procedure including infection, bleeding, tissue injury and further surgery. We discussed the low dose of radioactivity involved in the procedure. Informed, written consent was given. The usual time-out protocol was performed immediately prior to the procedure. Using ultrasound guidance, sterile technique, 1% lidocaine and an I-125 radioactive seed, LEFT  breast mass was localized using a lateral approach. The follow-up mammogram images confirm the seed in the expected location and were marked for Dr. Vernetta. Follow-up survey of the patient confirms presence of the radioactive seed. Order number of I-125 seed:  [id number]. Total activity:  0.242 mCi reference Date: 05/30/2024 The patient tolerated the procedure well and was [facility]. She was given  instructions regarding seed removal. IMPRESSION: Radioactive seed localization left breast. No apparent complications. Electronically Signed   By: Aliene Lloyd M.D.   On: 06/19/2024 09:31   US  LT BREAST BX W LOC DEV 1ST LESION IMG BX SPEC US  GUIDE Addendum Date: 05/29/2024 ADDENDUM REPORT: 05/29/2024 12:36 ADDENDUM: PATHOLOGY revealed: Breast, left, needle core biopsy, 5:00 6 cmfn (coil clip)- INVASIVE DUCTAL CARCINOMA- OVERALL GRADE: 2 LYMPHOVASCULAR INVASION: NOT IDENTIFIED- CANCER LENGTH: 0.7 CM- CALCIFICATIONS: NOT IDENTIFIED Pathology results are CONCORDANT with imaging findings, per Dr. Alm Parkins. Pathology results and recommendations below were discussed with patient by telephone on 05/29/2024. Patient reported biopsy site doing well with no adverse symptoms, and only slight tenderness at the site. Post biopsy care instructions were reviewed, questions were answered and my direct phone number was provided. Patient was instructed to call [facility] for any additional questions or concerns related to biopsy site. RECOMMENDATION: Surgical and oncological consultation. Request for surgical consultation relayed to Olam Bunnell and Landry Finger at [facility]. Pathology results reported by Mliss CHARM Molt RN 05/29/2024. Electronically Signed   By: Alm Parkins M.D.   On: 05/29/2024 12:36   Result Date: 05/29/2024 CLINICAL DATA:  Patient presents for ultrasound cardiac core needle biopsy of a left breast mass. She was scheduled for biopsy of 2 adjacent left breast masses. The smaller mass appeared smaller prior to biopsy, and appeared to deflate during the anaesthesia, no longer being visible, consistent with a cyst. Only the larger mass was biopsied. EXAM: ULTRASOUND GUIDED LEFT BREAST CORE NEEDLE BIOPSY COMPARISON:  Previous exam(s). PROCEDURE: I met with the patient and we discussed the procedure of ultrasound-guided biopsy, including benefits and alternatives. We  discussed the high likelihood of a successful procedure. We discussed the risks of the procedure, including infection, bleeding, tissue injury, clip migration, and inadequate sampling. Informed written consent was given. The usual time-out protocol was performed immediately prior to the procedure. Lesion quadrant: Lower outer quadrant Using sterile technique and 1% Lidocaine as local anesthetic, under direct ultrasound visualization, a 14 gauge spring-loaded device was used to perform biopsy of the 9 mm mass at 5 o'clock, 6 cm the nipple, using a medial approach. At the conclusion of the procedure a coil shaped tissue marker clip was deployed into the biopsy cavity. Follow up 2 view mammogram was performed and dictated separately. IMPRESSION: Ultrasound guided biopsy of a left breast mass. No apparent complications. Electronically Signed: By: Alm Parkins M.D. On: 05/28/2024 13:22   MM CLIP PLACEMENT LEFT Result Date: 05/28/2024 CLINICAL DATA:  Status post ultrasound-guided core needle biopsy of a left breast mass. Assess post biopsy marker clip placement. EXAM: 3D DIAGNOSTIC LEFT MAMMOGRAM POST ULTRASOUND BIOPSY COMPARISON:  Previous exam(s). ACR Breast Density Category b: There are scattered areas of fibroglandular density. FINDINGS: 3D Mammographic images were obtained following ultrasound guided biopsy of a left breast mass. The biopsy marking clip is in expected position at the site of biopsy. IMPRESSION: Appropriate positioning of the coil shaped biopsy marking clip at the site of biopsy in the lower outer left breast within the  mass. Final Assessment: Post Procedure Mammograms for Marker Placement Electronically Signed   By: Alm Parkins M.D.   On: 05/28/2024 13:27   MM 3D DIAGNOSTIC MAMMOGRAM UNILATERAL LEFT BREAST Result Date: 05/23/2024 CLINICAL DATA:  LEFT breast callback EXAM: DIGITAL DIAGNOSTIC UNILATERAL LEFT MAMMOGRAM WITH TOMOSYNTHESIS AND CAD; ULTRASOUND LEFT BREAST LIMITED TECHNIQUE: Left  digital diagnostic mammography and breast tomosynthesis was performed. The images were evaluated with computer-aided detection. ; Targeted ultrasound examination of the left breast was performed. COMPARISON:  Previous exam(s). ACR Breast Density Category b: There are scattered areas of fibroglandular density. FINDINGS: Spot compression tomosynthesis views demonstrates a persistent oval mass with indistinct margins. This is newly conspicuous since prior. Targeted ultrasound was performed of the LEFT breast. At 5 o'clock 5 cm from the nipple, there is an irregular hypoechoic mass with irregular margins. This measures 6 by 9 x 7 mm. This corresponds to the site of mammographic concern. Adjacent at 5 o'clock 6 cm from the nipple, there is an additional oval hypoechoic mass with angular margins. This measures 4 x 6 x 3 mm. This is approximately 9 mm from the margin of the dominant mass. Targeted ultrasound was performed of the LEFT axilla. No suspicious axillary lymph nodes are visualized. IMPRESSION: 1. There is a suspicious 9 mm mass at the site of screening mammographic concern. Recommend ultrasound-guided biopsy for definitive characterization. 2. There is an additional indeterminate 6 mm mass at 5 o'clock 6 cm from the nipple. Recommend ultrasound-guided biopsy for definitive characterization. 3. No suspicious LEFT axillary adenopathy. RECOMMENDATION: LEFT breast ultrasound-guided biopsy x2 I have discussed the findings and recommendations with the patient and patient's daughter. The biopsy procedure was discussed with the patient and questions were answered. Patient expressed their understanding of the biopsy recommendation. Patient will be scheduled for biopsy at her earliest convenience by the schedulers. Ordering provider will be notified. If applicable, a reminder letter will be sent to the patient regarding the next appointment. BI-RADS CATEGORY  5: Highly suggestive of malignancy. Electronically Signed   By:  Corean Salter M.D.   On: 05/23/2024 11:17   US  LIMITED ULTRASOUND INCLUDING AXILLA LEFT BREAST  Result Date: 05/23/2024 CLINICAL DATA:  LEFT breast callback EXAM: DIGITAL DIAGNOSTIC UNILATERAL LEFT MAMMOGRAM WITH TOMOSYNTHESIS AND CAD; ULTRASOUND LEFT BREAST LIMITED TECHNIQUE: Left digital diagnostic mammography and breast tomosynthesis was performed. The images were evaluated with computer-aided detection. ; Targeted ultrasound examination of the left breast was performed. COMPARISON:  Previous exam(s). ACR Breast Density Category b: There are scattered areas of fibroglandular density. FINDINGS: Spot compression tomosynthesis views demonstrates a persistent oval mass with indistinct margins. This is newly conspicuous since prior. Targeted ultrasound was performed of the LEFT breast. At 5 o'clock 5 cm from the nipple, there is an irregular hypoechoic mass with irregular margins. This measures 6 by 9 x 7 mm. This corresponds to the site of mammographic concern. Adjacent at 5 o'clock 6 cm from the nipple, there is an additional oval hypoechoic mass with angular margins. This measures 4 x 6 x 3 mm. This is approximately 9 mm from the margin of the dominant mass. Targeted ultrasound was performed of the LEFT axilla. No suspicious axillary lymph nodes are visualized. IMPRESSION: 1. There is a suspicious 9 mm mass at the site of screening mammographic concern. Recommend ultrasound-guided biopsy for definitive characterization. 2. There is an additional indeterminate 6 mm mass at 5 o'clock 6 cm from the nipple. Recommend ultrasound-guided biopsy for definitive characterization. 3. No suspicious LEFT axillary adenopathy.  RECOMMENDATION: LEFT breast ultrasound-guided biopsy x2 I have discussed the findings and recommendations with the patient and patient's daughter. The biopsy procedure was discussed with the patient and questions were answered. Patient expressed their understanding of the biopsy recommendation.  Patient will be scheduled for biopsy at her earliest convenience by the schedulers. Ordering provider will be notified. If applicable, a reminder letter will be sent to the patient regarding the next appointment. BI-RADS CATEGORY  5: Highly suggestive of malignancy. Electronically Signed   By: Corean Salter M.D.   On: 05/23/2024 11:17      IMPRESSION: Left breast invasive ductal carcinoma ER+, PR+, Her2 negative  ***  Today, I talked to the patient and family about the findings and work-up thus far.  We discussed the natural history of *** and general treatment, highlighting the role of radiotherapy in the management.  We discussed the available radiation techniques, and focused on the details of logistics and delivery.  We reviewed the anticipated acute and late sequelae associated with radiation in this setting.  The patient was encouraged to ask questions that I answered to the best of my ability. *** A patient consent form was discussed and signed.  We retained a copy for our records.  The patient would like to proceed with radiation and will be scheduled for CT simulation.  PLAN: ***    *** minutes of total time was spent for this patient encounter, including preparation, face-to-face counseling with the patient and coordination of care, physical exam, and documentation of the encounter.   ------------------------------------------------  Lynwood CHARM Nasuti, PhD, MD  This document serves as a record of services personally performed by Lynwood Nasuti, MD. It was created on his behalf by Reymundo Cartwright, a trained medical scribe. The creation of this record is based on the scribe's personal observations and the provider's statements to them. This document has been checked and approved by the attending provider.     [1]  Social History Tobacco Use   Smoking status: Never   Smokeless tobacco: Never  [2] No Known Allergies  "

## 2024-06-19 NOTE — Progress Notes (Signed)
 Location of Breast Cancer:left ***  Histology per Pathology Report: ***  Receptor Status: ER(***), PR (***), Her2-neu (***), Ki-67(***)  Did patient present with symptoms (if so, please note symptoms) or was this found on screening mammography?: ***  Past/Anticipated interventions by surgeon, if any:{t:21944} ***  Past/Anticipated interventions by medical oncology, if any: Chemotherapy ***  Lymphedema issues, if any:  {:18581} {t:21944}   Pain issues, if any:  {:18581} {PAIN DESCRIPTION:21022940}  SAFETY ISSUES: Prior radiation? {:18581} Pacemaker/ICD? {:18581} Possible current pregnancy?{:18581} Is the patient on methotrexate? {:18581}  Current Complaints / other details:  ***

## 2024-06-19 NOTE — Progress Notes (Signed)
 PCP - Monica Henderson at [facility] Cardiologist - denies  PPM/ICD - denies Device Orders - n/a Rep Notified -  n/a  Chest x-ray - denies EKG - 06/19/24 Stress Test - denies ECHO - denies Cardiac Cath - denies  Sleep Study - denies CPAP - n/a  Pre-diabetes - does not check blood sugar at home  Last dose of GLP1 agonist-  n/a GLP1 instructions:  n/a  Blood Thinner Instructions: n/a Aspirin Instructions: n/a  ERAS Protcol - claers until 0940 PRE-SURGERY Ensure or G2- Ensure as ordered  COVID TEST- na   Anesthesia review: yes - seed placed on 1/6  Patient denies shortness of breath, fever, cough and chest pain at PAT appointment   All instructions explained to the patient, with a verbal understanding of the material. Patient agrees to go over the instructions while at home for a better understanding. Patient also instructed to self quarantine after being tested for COVID-19. The opportunity to ask questions was provided.

## 2024-06-20 ENCOUNTER — Encounter: Payer: Self-pay | Admitting: Radiation Oncology

## 2024-06-20 ENCOUNTER — Ambulatory Visit
Admission: RE | Admit: 2024-06-20 | Discharge: 2024-06-20 | Disposition: A | Discharge status: Home / Self Care | Source: Ambulatory Visit | Attending: Radiation Oncology | Admitting: Radiation Oncology

## 2024-06-20 VITALS — BP 115/63 | HR 76 | Temp 97.0°F | Resp 18 | Wt 149.4 lb

## 2024-06-20 DIAGNOSIS — Z170 Estrogen receptor positive status [ER+]: Secondary | ICD-10-CM | POA: Insufficient documentation

## 2024-06-20 DIAGNOSIS — M51369 Other intervertebral disc degeneration, lumbar region without mention of lumbar back pain or lower extremity pain: Secondary | ICD-10-CM | POA: Diagnosis not present

## 2024-06-20 DIAGNOSIS — Z1732 Human epidermal growth factor receptor 2 negative status: Secondary | ICD-10-CM | POA: Insufficient documentation

## 2024-06-20 DIAGNOSIS — I10 Essential (primary) hypertension: Secondary | ICD-10-CM | POA: Insufficient documentation

## 2024-06-20 DIAGNOSIS — Z79899 Other long term (current) drug therapy: Secondary | ICD-10-CM | POA: Insufficient documentation

## 2024-06-20 DIAGNOSIS — Z803 Family history of malignant neoplasm of breast: Secondary | ICD-10-CM | POA: Insufficient documentation

## 2024-06-20 DIAGNOSIS — C50512 Malignant neoplasm of lower-outer quadrant of left female breast: Secondary | ICD-10-CM | POA: Insufficient documentation

## 2024-06-20 DIAGNOSIS — M129 Arthropathy, unspecified: Secondary | ICD-10-CM | POA: Insufficient documentation

## 2024-06-20 DIAGNOSIS — Z1721 Progesterone receptor positive status: Secondary | ICD-10-CM | POA: Diagnosis not present

## 2024-06-20 DIAGNOSIS — Z801 Family history of malignant neoplasm of trachea, bronchus and lung: Secondary | ICD-10-CM | POA: Diagnosis not present

## 2024-06-20 NOTE — H&P (Signed)
 " REFERRING PHYSICIAN: Cleotilde Planas PROVIDER: VICENTA DASIE POLI, MD MRN: [id number] DOB: Oct 29, 1934 DATE OF ENCOUNTER: 06/11/2024 Subjective   Chief Complaint: Left breast cancer  History of Present Illness: Monica Henderson is a 89 y.o. female who is seen as an office consultation for evaluation of newly diagnosed left breast cancer  This is an 89 year old female who was found to have a small mass on screening mammography of the left breast. Further imaging showed actually 2 small masses at the 5 and 6 o'clock position of the breast. One of the areas compressed and was consistent with a cyst at the time of her biopsy so only one of the masses was biopsied. This showed invasive ductal carcinoma. It was 100% ER and PR positive, HER2 negative, and had a Ki-67 of 5%. She has had no previous problems guarding her breast. She is otherwise fairly healthy. She has had no previous problems with surgery and has no cardiopulmonary issues. She has had no previous problems with her breast and there is no family history of breast cancer  Review of Systems: A complete review of systems was obtained from the patient. I have reviewed this information and discussed as appropriate with the patient. See HPI as well for other ROS.  ROS   Medical History: Past Medical History:  Diagnosis Date  Arthritis  History of cancer   There is no problem list on file for this patient.  Past Surgical History:  Procedure Laterality Date  HYSTERECTOMY    No Known Allergies  Current Outpatient Medications on File Prior to Visit  Medication Sig Dispense Refill  amLODIPine (NORVASC) 5 MG tablet Take 5 mg by mouth once daily  atorvastatin (LIPITOR) 10 MG tablet Take 10 mg by mouth once daily  betamethasone, augmented, (DIPROLENE) 0.05 % lotion APPLY A FEW DROPS TO AFFECTED AREA THEN RUB IN TWICE A DAY TO RASH EXTERNALLY 30  clobetasol (TEMOVATE) 0.05 % cream  gabapentin (NEURONTIN) 100 MG capsule take one  capsule by mouth 2 times daily.  KLOR-CON M20 20 mEq ER tablet Take 20 mEq by mouth once daily  triamterene-hydroCHLOROthiazide (MAXZIDE-25) 37.5-25 mg tablet Take 1 tablet by mouth once daily   No current facility-administered medications on file prior to visit.   Family History  Problem Relation Age of Onset  Stroke Sister  High blood pressure (Hypertension) Sister  Heart valve disease Brother    Social History   Tobacco Use  Smoking Status Never  Smokeless Tobacco Never    Social History   Socioeconomic History  Marital status: Married  Tobacco Use  Smoking status: Never  Smokeless tobacco: Never  Substance and Sexual Activity  Alcohol use: Never  Drug use: Never   Social Drivers of Corporate Investment Banker Strain: Low Risk (11/29/2023)  Received from [facility]  Overall Financial Resource Strain (CARDIA)  Difficulty of Paying Living Expenses: Not hard at all  Food Insecurity: No Food Insecurity (11/29/2023)  Received from [facility]  Hunger Vital Sign  Within the past 12 months, you worried that your food would run out before you got the money to buy more.: Never true  Within the past 12 months, the food you bought just didn't last and you didn't have money to get more.: Never true  Transportation Needs: No Transportation Needs (11/29/2023)  Received from [facility] - Transportation  Lack of Transportation (Medical): No  Lack of Transportation (Non-Medical): No  Housing Stability: Unknown (06/11/2024)  Housing Stability Vital Sign  Homeless in  the Last Year: No   Objective:   Vitals:   BP: 121/72  Pulse: 78  Temp: 36.8 C (98.2 F)  SpO2: 95%  Weight: 68.1 kg (150 lb 3.2 oz)  Height: 162.6 cm (5' 4)  PainSc: 0-No pain   Body mass index is 25.78 kg/m.  Physical Exam   She appears well on exam  A chaperone was present for the exam  There are no palpable breast masses. The nipple areolar complexes are normal  There is no  axillary adenopathy  Labs, Imaging and Diagnostic Testing: I have reviewed her mammograms, ultrasound, and pathology results  Assessment and Plan:   Diagnoses and all orders for this visit:  Invasive ductal carcinoma of left breast (CMS/HHS-HCC) - Ambulatory Referral to Radiation Oncology - Ambulatory Referral to Oncology-Medical   I discussed the pathology results with the patient and her daughter. I discussed the multidisciplinary approach to breast cancer. From a surgical standpoint I then discussed breast conservation with a lumpectomy versus mastectomy and the long-term results of each. She is interested in breast conservation. I discussed proceeding with a radioactive seed guided left breast lumpectomy. I explained the surgical procedure in detail. I discussed the risks which includes but is not limited to bleeding, infection, the need for further surgery if margins are positive, cardiopulmonary issues with anesthesia, seroma formation, postoperative recovery, excetra. She understands and again wished to proceed with surgery. I will also refer her to the cancer center to see both medical and radiation oncology for their opinions and treatment as well. Surgery will be scheduled  "

## 2024-06-20 NOTE — Progress Notes (Signed)
 Patient was called to be informed that the surgery time for tomorrow was changed to 11:43 o'clock. Patient was instructed to be at the hospital at 09:45 o'clock and to drink the pre-surgery ensure at 08:45 o'clock. Patient verbalized understanding.

## 2024-06-21 ENCOUNTER — Ambulatory Visit (HOSPITAL_COMMUNITY)
Admission: RE | Admit: 2024-06-21 | Discharge: 2024-06-21 | Disposition: A | Discharge status: Home / Self Care | Attending: Surgery | Admitting: Surgery

## 2024-06-21 ENCOUNTER — Ambulatory Visit (HOSPITAL_COMMUNITY): Admitting: Anesthesiology

## 2024-06-21 ENCOUNTER — Encounter (HOSPITAL_COMMUNITY)
Admission: RE | Disposition: A | Discharge status: Home / Self Care | Payer: Self-pay | Source: Home / Self Care | Attending: Surgery

## 2024-06-21 ENCOUNTER — Ambulatory Visit (HOSPITAL_COMMUNITY): Payer: Self-pay | Admitting: Vascular Surgery

## 2024-06-21 ENCOUNTER — Encounter (HOSPITAL_COMMUNITY): Payer: Self-pay | Admitting: Surgery

## 2024-06-21 ENCOUNTER — Other Ambulatory Visit: Payer: Self-pay

## 2024-06-21 ENCOUNTER — Ambulatory Visit
Admission: RE | Admit: 2024-06-21 | Discharge: 2024-06-21 | Disposition: A | Discharge status: Home / Self Care | Source: Ambulatory Visit | Attending: Surgery | Admitting: Surgery

## 2024-06-21 DIAGNOSIS — Z1721 Progesterone receptor positive status: Secondary | ICD-10-CM | POA: Diagnosis not present

## 2024-06-21 DIAGNOSIS — C50912 Malignant neoplasm of unspecified site of left female breast: Secondary | ICD-10-CM | POA: Diagnosis present

## 2024-06-21 DIAGNOSIS — Z1732 Human epidermal growth factor receptor 2 negative status: Secondary | ICD-10-CM | POA: Insufficient documentation

## 2024-06-21 DIAGNOSIS — Z170 Estrogen receptor positive status [ER+]: Secondary | ICD-10-CM | POA: Insufficient documentation

## 2024-06-21 DIAGNOSIS — M1990 Unspecified osteoarthritis, unspecified site: Secondary | ICD-10-CM | POA: Diagnosis not present

## 2024-06-21 DIAGNOSIS — R7303 Prediabetes: Secondary | ICD-10-CM | POA: Insufficient documentation

## 2024-06-21 DIAGNOSIS — Z853 Personal history of malignant neoplasm of breast: Secondary | ICD-10-CM

## 2024-06-21 DIAGNOSIS — I10 Essential (primary) hypertension: Secondary | ICD-10-CM

## 2024-06-21 DIAGNOSIS — Z79899 Other long term (current) drug therapy: Secondary | ICD-10-CM | POA: Diagnosis not present

## 2024-06-21 HISTORY — PX: BREAST LUMPECTOMY WITH RADIOACTIVE SEED LOCALIZATION: SHX6424

## 2024-06-21 SURGERY — BREAST LUMPECTOMY WITH RADIOACTIVE SEED LOCALIZATION
Anesthesia: General | Site: Breast | Laterality: Left

## 2024-06-21 MED ORDER — FENTANYL CITRATE (PF) 250 MCG/5ML IJ SOLN
INTRAMUSCULAR | Status: DC | PRN
  Administered 2024-06-21 (×2): 25 ug via INTRAVENOUS

## 2024-06-21 MED ORDER — PHENYLEPHRINE HCL-NACL 20-0.9 MG/250ML-% IV SOLN
INTRAVENOUS | Status: DC | PRN
  Administered 2024-06-21: 50 ug/min via INTRAVENOUS

## 2024-06-21 MED ORDER — CHLORHEXIDINE GLUCONATE 0.12 % MT SOLN
OROMUCOSAL | Status: AC
  Administered 2024-06-21: 15 mL via OROMUCOSAL
  Filled 2024-06-21: qty 15

## 2024-06-21 MED ORDER — LIDOCAINE 2% (20 MG/ML) 5 ML SYRINGE
INTRAMUSCULAR | Status: AC
  Filled 2024-06-21: qty 5

## 2024-06-21 MED ORDER — PROPOFOL 10 MG/ML IV BOLUS
INTRAVENOUS | Status: DC | PRN
  Administered 2024-06-21: 180 mg via INTRAVENOUS

## 2024-06-21 MED ORDER — ONDANSETRON HCL 4 MG/2ML IJ SOLN
INTRAMUSCULAR | Status: AC
  Filled 2024-06-21: qty 2

## 2024-06-21 MED ORDER — PHENYLEPHRINE 80 MCG/ML (10ML) SYRINGE FOR IV PUSH (FOR BLOOD PRESSURE SUPPORT)
PREFILLED_SYRINGE | INTRAVENOUS | Status: AC
  Filled 2024-06-21: qty 10

## 2024-06-21 MED ORDER — OXYCODONE HCL 5 MG PO TABS: 5.0000 mg | ORAL_TABLET | Freq: Once | ORAL | Status: DC | PRN

## 2024-06-21 MED ORDER — CEFAZOLIN SODIUM-DEXTROSE 2-4 GM/100ML-% IV SOLN
2.0000 g | INTRAVENOUS | Status: AC
  Administered 2024-06-21: 2 g via INTRAVENOUS

## 2024-06-21 MED ORDER — LIDOCAINE 2% (20 MG/ML) 5 ML SYRINGE
INTRAMUSCULAR | Status: DC | PRN
  Administered 2024-06-21: 80 mg via INTRAVENOUS

## 2024-06-21 MED ORDER — EPHEDRINE SULFATE-NACL 50-0.9 MG/10ML-% IV SOSY
PREFILLED_SYRINGE | INTRAVENOUS | Status: DC | PRN
  Administered 2024-06-21 (×2): 5 mg via INTRAVENOUS

## 2024-06-21 MED ORDER — ORAL CARE MOUTH RINSE: 15.0000 mL | Freq: Once | OROMUCOSAL | Status: AC

## 2024-06-21 MED ORDER — OXYCODONE HCL 5 MG/5ML PO SOLN: 5.0000 mg | Freq: Once | ORAL | Status: DC | PRN

## 2024-06-21 MED ORDER — BUPIVACAINE-EPINEPHRINE (PF) 0.25% -1:200000 IJ SOLN
INTRAMUSCULAR | Status: AC
  Filled 2024-06-21: qty 30

## 2024-06-21 MED ORDER — ACETAMINOPHEN 500 MG PO TABS: 1000.0000 mg | ORAL_TABLET | Freq: Once | ORAL | Status: AC

## 2024-06-21 MED ORDER — BUPIVACAINE-EPINEPHRINE 0.25% -1:200000 IJ SOLN
INTRAMUSCULAR | Status: DC | PRN
  Administered 2024-06-21: 8 mL

## 2024-06-21 MED ORDER — ONDANSETRON HCL 4 MG/2ML IJ SOLN: 4.0000 mg | Freq: Once | INTRAMUSCULAR | Status: DC | PRN

## 2024-06-21 MED ORDER — CEFAZOLIN SODIUM-DEXTROSE 2-4 GM/100ML-% IV SOLN
INTRAVENOUS | Status: AC
  Filled 2024-06-21: qty 100

## 2024-06-21 MED ORDER — ACETAMINOPHEN 500 MG PO TABS: 1000.0000 mg | ORAL_TABLET | ORAL | Status: AC

## 2024-06-21 MED ORDER — FENTANYL CITRATE (PF) 100 MCG/2ML IJ SOLN
INTRAMUSCULAR | Status: AC
  Filled 2024-06-21: qty 2

## 2024-06-21 MED ORDER — CHLORHEXIDINE GLUCONATE CLOTH 2 % EX PADS: 6.0000 | MEDICATED_PAD | Freq: Once | CUTANEOUS | Status: DC

## 2024-06-21 MED ORDER — CHLORHEXIDINE GLUCONATE 0.12 % MT SOLN: 15.0000 mL | Freq: Once | OROMUCOSAL | Status: AC

## 2024-06-21 MED ORDER — EPHEDRINE 5 MG/ML INJ
INTRAVENOUS | Status: AC
  Filled 2024-06-21: qty 5

## 2024-06-21 MED ORDER — ONDANSETRON HCL 4 MG/2ML IJ SOLN
INTRAMUSCULAR | Status: DC | PRN
  Administered 2024-06-21: 4 mg via INTRAVENOUS

## 2024-06-21 MED ORDER — ACETAMINOPHEN 500 MG PO TABS
ORAL_TABLET | ORAL | Status: AC
  Administered 2024-06-21: 1000 mg via ORAL
  Filled 2024-06-21: qty 2

## 2024-06-21 MED ORDER — ENSURE PRE-SURGERY PO LIQD: 296.0000 mL | Freq: Once | ORAL | Status: DC

## 2024-06-21 MED ORDER — LACTATED RINGERS IV SOLN: INTRAVENOUS | Status: DC

## 2024-06-21 MED ORDER — 0.9 % SODIUM CHLORIDE (POUR BTL) OPTIME
TOPICAL | Status: DC | PRN
  Administered 2024-06-21: 1000 mL

## 2024-06-21 MED ORDER — PROPOFOL 10 MG/ML IV BOLUS
INTRAVENOUS | Status: AC
  Filled 2024-06-21: qty 20

## 2024-06-21 MED ORDER — FENTANYL CITRATE (PF) 100 MCG/2ML IJ SOLN: 25.0000 ug | INTRAMUSCULAR | Status: DC | PRN

## 2024-06-21 MED ORDER — AMISULPRIDE (ANTIEMETIC) 5 MG/2ML IV SOLN: 10.0000 mg | Freq: Once | INTRAVENOUS | Status: DC | PRN

## 2024-06-21 MED ORDER — PHENYLEPHRINE 80 MCG/ML (10ML) SYRINGE FOR IV PUSH (FOR BLOOD PRESSURE SUPPORT)
PREFILLED_SYRINGE | INTRAVENOUS | Status: DC | PRN
  Administered 2024-06-21: 160 ug via INTRAVENOUS

## 2024-06-21 MED ORDER — TRAMADOL HCL 50 MG PO TABS: 50.0000 mg | ORAL_TABLET | Freq: Four times a day (QID) | ORAL | 0 refills | Status: DC | PRN

## 2024-06-21 SURGICAL SUPPLY — 24 items
BAG COUNTER SPONGE SURGICOUNT (BAG) ×1 IMPLANT
CANISTER SUCTION 3000ML PPV (SUCTIONS) ×1 IMPLANT
CHLORAPREP W/TINT 26 (MISCELLANEOUS) ×1 IMPLANT
CLIP APPLIE 9.375 MED OPEN (MISCELLANEOUS) ×1 IMPLANT
COVER PROBE W GEL 5X96 (DRAPES) ×1 IMPLANT
COVER SURGICAL LIGHT HANDLE (MISCELLANEOUS) ×1 IMPLANT
DERMABOND ADVANCED .7 DNX12 (GAUZE/BANDAGES/DRESSINGS) ×1 IMPLANT
DEVICE DUBIN SPECIMEN MAMMOGRA (MISCELLANEOUS) ×1 IMPLANT
DRAPE CHEST BREAST 15X10 FENES (DRAPES) ×1 IMPLANT
ELECT CAUTERY BLADE 6.4 (BLADE) ×1 IMPLANT
ELECTRODE REM PT RTRN 9FT ADLT (ELECTROSURGICAL) ×1 IMPLANT
GLOVE SURG SIGNA 7.5 PF LTX (GLOVE) ×1 IMPLANT
GOWN STRL REUS W/ TWL LRG LVL3 (GOWN DISPOSABLE) ×1 IMPLANT
GOWN STRL REUS W/ TWL XL LVL3 (GOWN DISPOSABLE) ×1 IMPLANT
KIT BASIN OR (CUSTOM PROCEDURE TRAY) ×1 IMPLANT
KIT MARKER MARGIN INK (KITS) ×1 IMPLANT
NEEDLE HYPO 25GX1X1/2 BEV (NEEDLE) ×1 IMPLANT
PACK GENERAL/GYN (CUSTOM PROCEDURE TRAY) ×1 IMPLANT
SOLN 0.9% NACL POUR BTL 1000ML (IV SOLUTION) IMPLANT
SUT MNCRL AB 4-0 PS2 18 (SUTURE) ×1 IMPLANT
SUT VIC AB 3-0 SH 18 (SUTURE) ×1 IMPLANT
SYR CONTROL 10ML LL (SYRINGE) ×1 IMPLANT
TOWEL GREEN STERILE (TOWEL DISPOSABLE) ×1 IMPLANT
TOWEL GREEN STERILE FF (TOWEL DISPOSABLE) ×1 IMPLANT

## 2024-06-21 NOTE — Op Note (Signed)
" ° °  Monica Henderson 06/21/2024   Pre-op Diagnosis: LEFT BREAST CANCER     Post-op Diagnosis: SAME  Procedures: RADIOACTIVE SEED GUIDED LEFT BREAST LUMPECTOMY  Surgeon(s): Vernetta Berg, MD Marguarite Faden, MD [facility] Resident  Anesthesia: General  Staff:  Circulator: Storm Maryelizabeth CROME, RN Scrub Person: Perez-Vasquez, Tiffany  Estimated Blood Loss: Minimal               Specimens: SENT TO PATH  Indication: This is an 89 year old female found to have a 9 mm mass in her left breast on mammography and ultrasound.  Biopsy showed invasive ductal carcinoma.  The decision was made to proceed with a radioactive seed guided left breast lumpectomy  Procedure: The patient was brought to the operating and identified the correct patient.  She was placed upon the operating table and general anesthesia was induced.  Her left breast was prepped and draped in usual sterile fashion.  Using the neoprobe we located the radioactive seed in the lower outer quadrant left breast just above the inframammary crease.  We anesthetized skin over this area with a Marcaine  and then made an elliptical incision with scalpel.  We then dissected down circumferentially to the breast tissue with electrocautery.  With the aid of neoprobe we then dissected widely in all directions around the signal from the radioactive seed.  We took this all the way down the chest wall and then complete the lumpectomy staying underneath the signal from the neoprobe.  Once the lumpectomy specimen was removed all margins were marked with paint.  An x-ray was performed confirming the radioactive seed and biopsy clip within the specimen.  The lobectomy specimen was not sent to pathology for evaluation.  We achieved hemostasis with cautery.  We injected further Marcaine  into the incision.  We then closed the subcutaneous tissue with interrupted 3-0 Vicryl sutures and closed the skin with a running 4-0 Monocryl.  Dermabond was then applied.  The  patient tolerated the procedure well.  All the counts were correct at the end of the procedure.  The patient was then extubated in the operating room and taken in a stable condition to the recovery room.          Berg Vernetta   Date: 06/21/2024  Time: 12:38 PM    "

## 2024-06-21 NOTE — Anesthesia Procedure Notes (Signed)
 Procedure Name: LMA Insertion Date/Time: 06/21/2024 12:00 PM  Performed by: Virgil Ee, CRNAPre-anesthesia Checklist: Patient identified, Patient being monitored, Timeout performed, Emergency Drugs available and Suction available Patient Re-evaluated:Patient Re-evaluated prior to induction Oxygen Delivery Method: Circle system utilized Preoxygenation: Pre-oxygenation with 100% oxygen Induction Type: IV induction Ventilation: Mask ventilation without difficulty LMA: LMA inserted LMA Size: 4.0 Tube type: Oral Number of attempts: 1 Placement Confirmation: positive ETCO2 and breath sounds checked- equal and bilateral Tube secured with: Tape Dental Injury: Teeth and Oropharynx as per pre-operative assessment

## 2024-06-21 NOTE — Anesthesia Postprocedure Evaluation (Signed)
"   Anesthesia Post Note  Patient: Monica Henderson  Procedure(s) Performed: BREAST LUMPECTOMY WITH RADIOACTIVE SEED LOCALIZATION (Left: Breast)     Patient location during evaluation: PACU Anesthesia Type: General Level of consciousness: awake and alert Pain management: pain level controlled Vital Signs Assessment: post-procedure vital signs reviewed and stable Respiratory status: spontaneous breathing, nonlabored ventilation, respiratory function stable and patient connected to nasal cannula oxygen Cardiovascular status: blood pressure returned to baseline and stable Postop Assessment: no apparent nausea or vomiting Anesthetic complications: no   No notable events documented.  Last Vitals:  Vitals:   06/21/24 1255 06/21/24 1300  BP: 125/61 (!) 118/58  Pulse: 80 73  Resp: 17 19  Temp:  36.5 C  SpO2: 96% 95%    Last Pain:  Vitals:   06/21/24 1300  TempSrc:   PainSc: 0-No pain                 Rome Ade      "

## 2024-06-21 NOTE — Anesthesia Preprocedure Evaluation (Addendum)
"                                    Anesthesia Evaluation  Patient identified by MRN, date of birth, ID band Patient awake    Reviewed: Allergy & Precautions, H&P , NPO status , Patient's Chart, lab work & pertinent test results  Airway Mallampati: III  TM Distance: >3 FB Neck ROM: Full    Dental  (+) Teeth Intact, Dental Advisory Given   Pulmonary neg pulmonary ROS   Pulmonary exam normal breath sounds clear to auscultation       Cardiovascular hypertension (128/71 preop, took amlodipine this AM and traimterene/hctz yesterday), Pt. on medications Normal cardiovascular exam Rhythm:Regular Rate:Normal     Neuro/Psych negative neurological ROS  negative psych ROS   GI/Hepatic negative GI ROS, Neg liver ROS,,,  Endo/Other  diabetes (prediabetic)    Renal/GU negative Renal ROS  negative genitourinary   Musculoskeletal  (+) Arthritis , Osteoarthritis,    Abdominal   Peds negative pediatric ROS (+)  Hematology negative hematology ROS (+) Hb 13.4   Anesthesia Other Findings L breast ca   Reproductive/Obstetrics negative OB ROS                              Anesthesia Physical Anesthesia Plan  ASA: 3  Anesthesia Plan: General   Post-op Pain Management: Tylenol  PO (pre-op)*   Induction: Intravenous  PONV Risk Score and Plan: 3 and Ondansetron , Dexamethasone, Midazolam and Treatment may vary due to age or medical condition  Airway Management Planned: LMA  Additional Equipment: None  Intra-op Plan:   Post-operative Plan: Extubation in OR  Informed Consent: I have reviewed the patients History and Physical, chart, labs and discussed the procedure including the risks, benefits and alternatives for the proposed anesthesia with the patient or authorized representative who has indicated his/her understanding and acceptance.     Dental advisory given  Plan Discussed with: CRNA  Anesthesia Plan Comments:           Anesthesia Quick Evaluation  "

## 2024-06-21 NOTE — Transfer of Care (Signed)
 Immediate Anesthesia Transfer of Care Note  Patient: [name]  Procedure(s) Performed: BREAST LUMPECTOMY WITH RADIOACTIVE SEED LOCALIZATION (Left: Breast)  Patient Location: PACU  Anesthesia Type:General  Level of Consciousness: awake  Airway & Oxygen Therapy: Patient Spontanous Breathing  Post-op Assessment: Report given to RN and Post -op Vital signs reviewed and stable  Post vital signs: Reviewed and stable  Last Vitals:  Vitals Value Taken Time  BP 125/61 06/21/24 12:55  Temp    Pulse 73 06/21/24 12:57  Resp 15 06/21/24 12:57  SpO2 96 % 06/21/24 12:57  Vitals shown include unfiled device data.  Last Pain:  Vitals:   06/21/24 1031  TempSrc:   PainSc: 0-No pain         Complications: No notable events documented.

## 2024-06-21 NOTE — Interval H&P Note (Signed)
 History and Physical Interval Note:no change in H and P  06/21/2024 10:46 AM  Monica Henderson  has presented today for surgery, with the diagnosis of LEFT BREAST CANCER.  The various methods of treatment have been discussed with the patient and family. After consideration of risks, benefits and other options for treatment, the patient has consented to  Procedures with comments: BREAST LUMPECTOMY WITH RADIOACTIVE SEED LOCALIZATION (Left) - LMA as a surgical intervention.  The patient's history has been reviewed, patient examined, no change in status, stable for surgery.  I have reviewed the patient's chart and labs.  Questions were answered to the patient's satisfaction.     Vicenta Poli

## 2024-06-21 NOTE — Discharge Instructions (Signed)
[facility] Office Phone Number [phone]  BREAST BIOPSY/ PARTIAL MASTECTOMY: POST OP INSTRUCTIONS  Always review your discharge instruction sheet given to you by the facility where your surgery was performed.  IF YOU HAVE DISABILITY OR FAMILY LEAVE FORMS, YOU MUST BRING THEM TO THE OFFICE FOR PROCESSING.  DO NOT GIVE THEM TO YOUR DOCTOR.  A prescription for pain medication may be given to you upon discharge.  Take your pain medication as prescribed, if needed.  If narcotic pain medicine is not needed, then you may take acetaminophen (Tylenol) or ibuprofen (Advil) as needed. Take your usually prescribed medications unless otherwise directed If you need a refill on your pain medication, please contact your pharmacy.  They will contact our office to request authorization.  Prescriptions will not be filled after 5pm or on week-ends. You should eat very light the first 24 hours after surgery, such as soup, crackers, pudding, etc.  Resume your normal diet the day after surgery. Most patients will experience some swelling and bruising in the breast.  Ice packs and a good support bra will help.  Swelling and bruising can take several days to resolve.  It is common to experience some constipation if taking pain medication after surgery.  Increasing fluid intake and taking a stool softener will usually help or prevent this problem from occurring.  A mild laxative (Milk of Magnesia or Miralax) should be taken according to package directions if there are no bowel movements after 48 hours. Unless discharge instructions indicate otherwise, you may remove your bandages 24-48 hours after surgery, and you may shower at that time.  You may have steri-strips (small skin tapes) in place directly over the incision.  These strips should be left on the skin for 7-10 days.  If your surgeon used skin glue on the incision, you may shower in 24 hours.  The glue will flake off over the next 2-3 weeks.  Any  sutures or staples will be removed at the office during your follow-up visit. ACTIVITIES:  You may resume regular daily activities (gradually increasing) beginning the next day.  Wearing a good support bra or sports bra minimizes pain and swelling.  You may have sexual intercourse when it is comfortable. You may drive when you no longer are taking prescription pain medication, you can comfortably wear a seatbelt, and you can safely maneuver your car and apply brakes. RETURN TO WORK:  ______________________________________________________________________________________ You should see your doctor in the office for a follow-up appointment approximately two weeks after your surgery.  Your doctor's nurse will typically make your follow-up appointment when she calls you with your pathology report.  Expect your pathology report 2-3 business days after your surgery.  You may call to check if you do not hear from us after three days. OTHER INSTRUCTIONS: YOU MAY SHOWER STARTING TOMORROW ICE PACK, TYLENOL, AND IBUPROFEN ALSO FOR PAIN NO VIGOROUS ACTIVITY FOR ONE WEEK _______________________________________________________________________________________________ _____________________________________________________________________________________________________________________________________ _____________________________________________________________________________________________________________________________________ _____________________________________________________________________________________________________________________________________  WHEN TO CALL YOUR DOCTOR: Fever over 101.0 Nausea and/or vomiting. Extreme swelling or bruising. Continued bleeding from incision. Increased pain, redness, or drainage from the incision.  The clinic staff is available to answer your questions during regular business hours.  Please don't hesitate to call and ask to speak to one of the nurses for clinical  concerns.  If you have a medical emergency, go to the nearest emergency room or call 911.  A surgeon from [facility] is always on call at the hospital.  For further questions, please visit   [email or web address]  

## 2024-06-22 ENCOUNTER — Encounter (HOSPITAL_COMMUNITY): Payer: Self-pay | Admitting: Surgery

## 2024-06-26 LAB — SURGICAL PATHOLOGY

## 2024-07-03 ENCOUNTER — Inpatient Hospital Stay

## 2024-07-03 ENCOUNTER — Inpatient Hospital Stay: Attending: Hematology and Oncology | Admitting: Hematology and Oncology

## 2024-07-03 DIAGNOSIS — Z801 Family history of malignant neoplasm of trachea, bronchus and lung: Secondary | ICD-10-CM | POA: Insufficient documentation

## 2024-07-03 DIAGNOSIS — Z170 Estrogen receptor positive status [ER+]: Secondary | ICD-10-CM | POA: Diagnosis not present

## 2024-07-03 DIAGNOSIS — Z1721 Progesterone receptor positive status: Secondary | ICD-10-CM | POA: Diagnosis not present

## 2024-07-03 DIAGNOSIS — C50512 Malignant neoplasm of lower-outer quadrant of left female breast: Secondary | ICD-10-CM | POA: Insufficient documentation

## 2024-07-03 DIAGNOSIS — Z1731 Human epidermal growth factor receptor 2 positive status: Secondary | ICD-10-CM | POA: Diagnosis not present

## 2024-07-03 NOTE — Assessment & Plan Note (Signed)
 05/28/2024: Screening mammogram and ultrasound detected 9 mm left breast mass at 5 o'clock position 5 cm from the nipple, additional oval mass 6 mm, biopsy: Grade 2 IDC ER 100%, PR 100%, Ki67 5%, HER2 1+ negative  06/21/2024: Left lumpectomy Dr. Vernetta: Grade 2 IDC 1 cm, margins negative, lymph nodes not evaluated, ER 100%, PR 100%, Ki67 5%, HER2 1+ negative  Pathology counseling: I discussed the final pathology report of the patient provided  a copy of this report. I discussed the margins as well as lymph node surgeries. We also discussed the final staging along with previously performed ER/PR and HER-2/neu testing.  Treatment plan: Discussion between radiation versus antiestrogen therapy. Anastrozole counseling: We discussed the risks and benefits of anti-estrogen therapy with aromatase inhibitors. These include but not limited to insomnia, hot flashes, mood changes, vaginal dryness, bone density loss, and weight gain. We strongly believe that the benefits far outweigh the risks. Patient understands these risks and consented to starting treatment. Planned treatment duration is 5 years.

## 2024-07-03 NOTE — Progress Notes (Signed)
 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  Her breast cancer was detected on screening mammogram, followed by ultrasound and biopsy confirming a ~1 cm invasive ductal carcinoma of the left breast without lymph node involvement on imaging. She underwent lumpectomy with no lymph nodes removed and has recovered well without current surgical-site symptoms.  Pathology showed 100% estrogen and progesterone receptor positivity, HER2 negativity, and a Ki-67 of 5%, consistent with a low-proliferation tumor.  During this visit, adjuvant options were reviewed. She has already seen radiation oncology and discussed both conventional daily and ultrahypofractionated weekly radiation, with reported equivalent outcomes.       I reviewed her records extensively and collaborated the history with the patient.  SUMMARY OF ONCOLOGIC HISTORY: Oncology History   No problem history exists.     MEDICAL HISTORY:  Past Medical History:  Diagnosis Date   Arthritis    Degenerative disc disease, lumbar    Family history of adverse reaction to anesthesia    daughter - PONV   Hypertension    Pre-diabetes     SURGICAL HISTORY: Past Surgical History:  Procedure Laterality Date   ABDOMINAL HYSTERECTOMY     BREAST BIOPSY Left 05/28/2024   US  LT BREAST BX W LOC DEV 1ST LESION IMG BX SPEC US  GUIDE 05/28/2024 [facility] M[facility]MO[facility]   BREAST BIOPSY Left 06/19/2024   US  LT RADIOACTIVE SEED LOC 06/19/2024 [facility]MO[facility]   BREAST LUMPECTOMY WITH RADIOACTIVE SEED LOCALIZATION Left  06/21/2024   Procedure: BREAST LUMPECTOMY WITH RADIOACTIVE SEED LOCALIZATION;  Surgeon: Vernetta Berg, MD;  Location: [facility] OR;  Service: General;  Laterality: Left;  LMA   CATARACT EXTRACTION Bilateral    COLONOSCOPY     RECTOCELE REPAIR      SOCIAL HISTORY: Social History   Socioeconomic History   Marital status: Married    Spouse name: Not on file   Number of children: Not on file   Years of education: Not on file   Highest education level: Not on file  Occupational History   Not on file  Tobacco Use   Smoking status: Never   Smokeless tobacco: Never  Vaping Use   Vaping status: Never Used  Substance and Sexual Activity   Alcohol use: Never   Drug use: Never   Sexual activity: Not on file  Other Topics Concern   Not on file  Social History Narrative   Not on file   Social Drivers of Health   Tobacco Use: Low Risk (06/21/2024)   Patient History    Smoking Tobacco Use: Never    Smokeless Tobacco Use: Never    Passive Exposure: Not on file  Financial Resource Strain: Low Risk (11/29/2023)   Received from [facility]   Overall Financial Resource Strain (CARDIA)    Difficulty of Paying Living Expenses: Not hard at all  Food Insecurity: No Food Insecurity (07/03/2024)   Epic    Worried About Radiation Protection Practitioner of Food in the Last Year: Never true    [employer] of Food in the Last Year: Never true  Transportation Needs: Unknown (07/03/2024)   Epic    Lack of Transportation (Medical): No  Lack of Transportation (Non-Medical): Not on file  Physical Activity: Not on file  Stress: Not on file  Social Connections: Not on file  Intimate Partner Violence: Not At Risk (07/03/2024)   Epic    Fear of Current or Ex-Partner: No    Emotionally Abused: No    Physically Abused: No    Sexually Abused: No  Depression (PHQ2-9): Low Risk (07/03/2024)   Depression (PHQ2-9)    PHQ-2 Score: 0  Alcohol Screen: Not on file  Housing: Unknown (07/03/2024)   Epic    Unable to Pay for Housing in  the Last Year: Not on file    Number of Times Moved in the Last Year: 0    Homeless in the Last Year: Not on file  Utilities: Not At Risk (07/03/2024)   Epic    Threatened with loss of utilities: No  Health Literacy: Not on file    FAMILY HISTORY: Family History  Problem Relation Age of Onset   Cancer Father        lung   Cancer Brother        lung   Breast cancer Neg Hx     ALLERGIES:  has no known allergies.  MEDICATIONS:  Current Outpatient Medications  Medication Sig Dispense Refill   acetaminophen  (TYLENOL ) 500 MG tablet Take 500 mg by mouth every 6 (six) hours as needed for moderate pain (pain score 4-6).     amLODipine (NORVASC) 5 MG tablet Take 5 mg by mouth daily.     gabapentin (NEURONTIN) 100 MG capsule Take 100 mg by mouth at bedtime as needed (sciatic nerve pain).     KLOR-CON M20 20 MEQ tablet Take 20 mEq by mouth daily.     sodium chloride  (OCEAN) 0.65 % SOLN nasal spray Place 1 spray into both nostrils as needed for congestion.     triamcinolone cream (KENALOG) 0.1 % Apply 1 Application topically daily as needed (irritation).     triamterene-hydrochlorothiazide (MAXZIDE-25) 37.5-25 MG tablet Take 1 tablet by mouth daily.     No current facility-administered medications for this visit.    REVIEW OF SYSTEMS:   Constitutional: Denies fevers, chills or abnormal night sweats   All other systems were reviewed with the patient and are negative.  PHYSICAL EXAMINATION: ECOG PERFORMANCE STATUS: 0 - Asymptomatic  GENERAL:alert, no distress and comfortable    LABORATORY DATA:  I have reviewed the data as listed Lab Results  Component Value Date   WBC 6.4 06/19/2024   HGB 13.4 06/19/2024   HCT 40.5 06/19/2024   MCV 94.0 06/19/2024   PLT 233 06/19/2024   Lab Results  Component Value Date   NA 143 06/19/2024   K 4.0 06/19/2024   CL 103 06/19/2024   CO2 31 06/19/2024    RADIOGRAPHIC STUDIES: I have personally reviewed the radiological reports and agreed  with the findings in the report.  ASSESSMENT AND PLAN:  Malignant neoplasm of lower-outer quadrant of left breast of female, estrogen receptor positive (HCC) 05/28/2024: Screening mammogram and ultrasound detected 9 mm left breast mass at 5 o'clock position 5 cm from the nipple, additional oval mass 6 mm, biopsy: Grade 2 IDC ER 100%, PR 100%, Ki67 5%, HER2 1+ negative  06/21/2024: Left lumpectomy Dr. Vernetta: Grade 2 IDC 1 cm, margins negative, lymph nodes not evaluated, ER 100%, PR 100%, Ki67 5%, HER2 1+ negative  Pathology counseling: I discussed the final pathology report of the patient provided  a copy of this report. I discussed the  margins as well as lymph node surgeries. We also discussed the final staging along with previously performed ER/PR and HER-2/neu testing.  Treatment plan: Discussion between radiation versus antiestrogen therapy.  I recommended that she only needs either radiation or antiestrogen therapy and patient chose radiation. Anastrozole counseling: We discussed the risks and benefits of anti-estrogen therapy with aromatase inhibitors. These include but not limited to insomnia, hot flashes, mood changes, vaginal dryness, bone density loss, and weight gain.  Decision: Adjuvant radiation.  Patient tells me that she was offered ultra hypofractionation and that is what she would like to do. Follow-up in 3 months for survivorship care plan visit with [facility].    All questions were answered. The patient knows to call the clinic with any problems, questions or concerns. I personally spent a total of 30 minutes in the care of the patient today including preparing to see the patient, getting/reviewing separately obtained history, performing a medically appropriate exam/evaluation, counseling and educating, placing orders, referring and communicating with other health care professionals, documenting clinical information in the EHR, independently interpreting results, communicating  results, and coordinating care.   Viinay K Davanna He, MD 07/03/24

## 2024-07-04 ENCOUNTER — Telehealth: Payer: Self-pay | Admitting: Radiation Oncology

## 2024-07-04 NOTE — Telephone Encounter (Signed)
LVM to schedule FUN/SIM with Dr. Roselind Messier

## 2024-07-06 ENCOUNTER — Telehealth: Payer: Self-pay | Admitting: Radiation Oncology

## 2024-07-06 NOTE — Telephone Encounter (Signed)
 Called pt to schedule FUN/SIM with Dr. Shannon 4 weeks post-op. Pt requested delay in consultation/SIM to be able to  have her daughter attend appts. She advised daughter will be in town 2/12-2/16. I advised I have placed a hold 2/16@8 :30am and that I would check with Dr. Shannon to see if it is ok to schedule

## 2024-07-07 ENCOUNTER — Encounter: Payer: Self-pay | Admitting: *Deleted

## 2024-07-07 NOTE — Progress Notes (Signed)
 Met patient at initial apt with Dr. Gudena. Introduced role of navigation and gave her my contact information. Healing well from surgery on 1/8. Plans for XRT then has a follow up SCP visit with Morna on 4/2.  No barriers or navigation needs noted.

## 2024-07-12 ENCOUNTER — Telehealth: Payer: Self-pay | Admitting: Radiation Oncology

## 2024-07-12 NOTE — Telephone Encounter (Signed)
 Per [place], ok to have small delay for pt to bring daughter to appt. LVM to schedule FUN/SIM with Dr. Shannon.

## 2024-07-30 ENCOUNTER — Ambulatory Visit: Admitting: Radiation Oncology

## 2024-07-30 ENCOUNTER — Ambulatory Visit

## 2024-09-13 ENCOUNTER — Inpatient Hospital Stay: Admitting: Adult Health

## 2024-09-13 ENCOUNTER — Inpatient Hospital Stay
